# Patient Record
Sex: Female | Born: 1940
Health system: Southern US, Community
[De-identification: ages and names within clinical notes are randomized; demographics above are authoritative.]

## PROBLEM LIST (undated history)

## (undated) DIAGNOSIS — I10 Essential (primary) hypertension: Secondary | ICD-10-CM

## (undated) DIAGNOSIS — E119 Type 2 diabetes mellitus without complications: Secondary | ICD-10-CM

## (undated) DIAGNOSIS — E785 Hyperlipidemia, unspecified: Secondary | ICD-10-CM

## (undated) HISTORY — DX: Type 2 diabetes mellitus without complications: E11.9

## (undated) HISTORY — DX: Hyperlipidemia, unspecified: E78.5

## (undated) HISTORY — DX: Essential (primary) hypertension: I10

---

## 1973-09-01 HISTORY — PX: ABDOMINAL HYSTERECTOMY: SHX81

## 2001-03-01 HISTORY — PX: CATARACT EXTRACTION: SUR2

## 2004-09-20 ENCOUNTER — Ambulatory Visit: Payer: Self-pay

## 2005-03-25 ENCOUNTER — Ambulatory Visit: Payer: Self-pay | Admitting: Internal Medicine

## 2005-03-25 LAB — HM COLONOSCOPY

## 2008-01-30 ENCOUNTER — Emergency Department: Payer: Self-pay | Admitting: Emergency Medicine

## 2010-05-18 ENCOUNTER — Ambulatory Visit: Payer: Self-pay | Admitting: Family Medicine

## 2010-05-18 DIAGNOSIS — I10 Essential (primary) hypertension: Secondary | ICD-10-CM | POA: Insufficient documentation

## 2010-09-29 LAB — CONVERTED CEMR LAB
Glucose, Urine, Semiquant: NEGATIVE
Protein, U semiquant: 100
Urobilinogen, UA: 0.2
pH: 6

## 2010-10-02 NOTE — Assessment & Plan Note (Signed)
Summary: uti/jbb   Vital Signs:  Patient Profile:   70 Years Old Female CC:      lower abdominal pressure, urinary frequency, burning with urination Height:     64.5 inches Weight:      200 pounds O2 Sat:      97 % O2 treatment:    Room Air Temp:     98.3 degrees F oral Pulse rate:   92 / minute Resp:     16 per minute BP sitting:   158 / 90  (left arm)  Vitals Entered By: Adella Hare LPN (May 18, 2010 5:37 PM)                  Prior Medication List:  No prior medications documented  Current Allergies (reviewed today): ! TETRACYCLINE ! AMOXICILLIN ! * BEEF ! * SHRIMPHistory of Present Illness Chief Complaint: lower abdominal pressure, urinary frequency, burning with urination History of Present Illness: Patient is a 70 YOWF w/HX of recurrent UTI and hemorrhagic cystitis. She didstarted having symptoms about a week ago and it has progressive got worse. She has seen blood yet but w/ hr increase symptoms she feels as if that is next.  Current Problems: URINARY TRACT INFECTION (ICD-599.0) HYPERTENSION (ICD-401.9)   Current Meds COZAAR 50 MG TABS (LOSARTAN POTASSIUM) one tab by mouth once daily CIPRO 500 MG TABS (CIPROFLOXACIN HCL) 1 by mouth 2 times daily take for 7-10 days DIFLUCAN 150 MG TABS (FLUCONAZOLE) once daily  REVIEW OF SYSTEMS Constitutional Symptoms      Denies fever, chills, night sweats, weight loss, weight gain, and fatigue.  Eyes       Complains of glasses.      Denies eye pain. Ear/Nose/Throat/Mouth       Denies hearing loss/aids, change in hearing, ear pain, frequent runny nose, sore throat, and hoarseness.  Respiratory       Denies dry cough, productive cough, wheezing, shortness of breath, asthma, bronchitis, and emphysema/COPD.  Cardiovascular       Denies murmurs, chest pain, and tires easily with exhertion.    Gastrointestinal       Denies stomach pain, nausea/vomiting, diarrhea, and constipation.      Comments: hx of  diverticulitis Genitourniary       Complains of painful urination.      Denies kidney stones and loss of urinary control. Neurological       Complains of headaches.      Denies numbness and tngling. Musculoskeletal       Complains of muscle pain and muscle weakness.      Denies joint pain, joint stiffness, and gout.  Skin       Denies unusual mles/lumps or sores.  Psych       Denies anxiety/stress.  Past History:  Past Medical History: Hypertension  Past Surgical History: Hysterectomy  Family History: Reviewed history and no changes required. mother deceased- DM father deceased one brother living- heart disease one sister living- heart disease  Social History: Reviewed history and no changes required. Retired  Problems Prior to Update: None  Medications Prior to Update: 1)  None  Current Medications (verified): 1)  Cozaar 50 Mg Tabs (Losartan Potassium) .... One Tab By Mouth Once Daily  Allergies (verified): 1)  ! Tetracycline 2)  ! Amoxicillin 3)  ! * Beef 4)  ! * Shrimp  Physical Exam General appearance: well developed, well nourished, mild discomfort Head: normocephalic, atraumatic, anterior cervical lymphadenopathy Neck: neck supple,  trachea midline, no masses  Abdomen: mild lower abdominal tenderness Back: mild R CVA tenderness Skin: no obvious rashes or lesions MSE: oriented to time, place, and person Assessment New Problems: URINARY TRACT INFECTION (ICD-599.0) HYPERTENSION (ICD-401.9)  Hemorrhagic UTI  Patient Education: Patient and/or caregiver instructed in the following: rest fluids and Tylenol.  Plan New Medications/Changes: DIFLUCAN 150 MG TABS (FLUCONAZOLE) once daily  #1 x 0, 05/18/2010, Hassan Rowan MD CIPRO 500 MG TABS (CIPROFLOXACIN HCL) 1 by mouth 2 times daily take for 7-10 days  #20 x 0, 05/18/2010, Hassan Rowan MD  New Orders: Urinalysis-dipstick only (Medicare patient) [[id number]] T-Culture, Urine [[id number]] New Patient  Level III [78469] Follow Up: Follow up in 2-3 days if no improvement, Follow up on an as needed basis, Follow up with Primary Physician  The patient and/or caregiver has been counseled thoroughly with regard to medications prescribed including dosage, schedule, interactions, rationale for use, and possible side effects and they verbalize understanding.  Diagnoses and expected course of recovery discussed and will return if not improved as expected or if the condition worsens. Patient and/or caregiver verbalized understanding.  Prescriptions: DIFLUCAN 150 MG TABS (FLUCONAZOLE) once daily  #1 x 0   Entered and Authorized by:   Hassan Rowan MD   Signed by:   Hassan Rowan MD on 05/18/2010   Method used:   Print then Give to Patient   RxID:   [id number] CIPRO 500 MG TABS (CIPROFLOXACIN HCL) 1 by mouth 2 times daily take for 7-10 days  #20 x 0   Entered and Authorized by:   Hassan Rowan MD   Signed by:   Hassan Rowan MD on 05/18/2010   Method used:   Print then Give to Patient   RxID:   [id number]   Patient Instructions: 1)  Please schedule a follow-up appointment in 10-14 days for recheck of urine here or at PCP 2)  Take your antibiotic as prescribed until ALL of it is gone, but stop if you develop a rash or swelling and contact our office as soon as possible. 3)  Please schedule a follow-up appointment as needed. 4)  Cipro dose in 8/10 was 750mg  you were given 500mg  by mouth instead but may take from 7 to 10 days  Orders Added: 1)  Urinalysis-dipstick only (Medicare patient) [[id number]] 2)  T-Culture, Urine [[id number]] 3)  New Patient Level III [99203]  Laboratory Results   Urine Tests  Date/Time Received: May 18, 2010 5:29 PM  Date/Time Reported: May 18, 2010 5:29 PM   Routine Urinalysis   Color: yellow Appearance: Clear Glucose: negative   (Normal Range: Negative) Bilirubin: negative   (Normal Range: Negative) Ketone: negative   (Normal Range:  Negative) Spec. Gravity: 1.025   (Normal Range: 1.003-1.035) Blood: moderate   (Normal Range: Negative) pH: 6.0   (Normal Range: 5.0-8.0) Protein: 100   (Normal Range: Negative) Urobilinogen: 0.2   (Normal Range: 0-1) Nitrite: negative   (Normal Range: Negative) Leukocyte Esterace: trace   (Normal Range: Negative)

## 2011-06-10 ENCOUNTER — Ambulatory Visit: Payer: Self-pay | Admitting: Family Medicine

## 2011-07-10 ENCOUNTER — Ambulatory Visit: Payer: Self-pay | Admitting: Family Medicine

## 2013-11-17 ENCOUNTER — Ambulatory Visit: Payer: Self-pay | Admitting: Family Medicine

## 2014-03-11 ENCOUNTER — Ambulatory Visit: Payer: Self-pay | Admitting: Specialist

## 2014-03-20 ENCOUNTER — Ambulatory Visit: Payer: Self-pay | Admitting: Specialist

## 2014-03-20 DIAGNOSIS — I10 Essential (primary) hypertension: Secondary | ICD-10-CM

## 2014-03-20 DIAGNOSIS — E119 Type 2 diabetes mellitus without complications: Secondary | ICD-10-CM

## 2014-03-20 LAB — BASIC METABOLIC PANEL
ANION GAP: 8 (ref 7–16)
BUN: 13 mg/dL (ref 7–18)
CHLORIDE: 104 mmol/L (ref 98–107)
CO2: 27 mmol/L (ref 21–32)
Calcium, Total: 9.3 mg/dL (ref 8.5–10.1)
Creatinine: 0.95 mg/dL (ref 0.60–1.30)
EGFR (Non-African Amer.): >60
Glucose: 177 mg/dL — ABNORMAL HIGH (ref 65–99)
OSMOLALITY: 282 (ref 275–301)
POTASSIUM: 3.9 mmol/L (ref 3.5–5.1)
SODIUM: 139 mmol/L (ref 136–145)

## 2014-03-24 ENCOUNTER — Ambulatory Visit: Payer: Self-pay | Admitting: Specialist

## 2014-03-24 HISTORY — PX: KNEE SURGERY: SHX244

## 2014-06-06 LAB — HM PAP SMEAR: HM Pap smear: NORMAL

## 2014-07-10 ENCOUNTER — Ambulatory Visit: Payer: Self-pay

## 2014-07-10 LAB — HM MAMMOGRAPHY: HM Mammogram: NORMAL

## 2014-07-13 ENCOUNTER — Ambulatory Visit: Payer: Self-pay | Admitting: Family Medicine

## 2014-12-23 NOTE — Op Note (Signed)
PATIENT NAME:  Danielle Jackson, [name] MR#:  [id number] DATE OF BIRTH:  04/22/1941  DATE OF PROCEDURE:  03/24/2014  PREOPERATIVE DIAGNOSIS: Medial meniscus tear, right knee.   POSTOPERATIVE DIAGNOSES: 1.  Large tear posterior horn medial meniscus, right knee.  2.  Large medial suprapatellar plica of the right knee.  3.  Moderate patellofemoral degenerative arthritis.  4.  Moderate chondromalacia medial compartment articular surfaces.   PROCEDURES: 1.  Arthroscopic partial medial meniscectomy.  2.  Arthroscopic resection medial suprapatellar plica.   SURGEON: Myra Rudehristopher , M.D.   ANESTHESIA: General.   COMPLICATIONS: None.   BLOOD LOSS: None.   DESCRIPTION OF PROCEDURE: After adequate induction of general anesthesia, the right lower extremity is secured in a leg holder in the usual manner for arthroscopy. The right knee is thoroughly prepped with alcohol and ChloraPrep and draped in standard sterile fashion. The joint is infiltrated with 5 mL of Marcaine with epinephrine. Diagnostic arthroscopy is performed. There is a slight joint effusion present. There is mildly increased synovitis in the suprapatellar pouch. There is a large medial suprapatellar plica with a prominent area of erosion of the medial femoral condyle where this is rubbing on flexion and extension. There is moderate degenerative change also on this medial side and the patellofemoral joint. In the medial compartment, there is mild to moderate chondromalacia of the articular surface that is associated with a large complex tear of the posterior horn of the medial meniscus. Using the basket forceps, the full radial resector and the ArthroWand the torn portion of the meniscus was resected back to a clean stable rim. Likewise, using the full radial resector and the ArthroWand, the medial suprapatellar plica was completely excised. The anterior cruciate ligament is normal. In the lateral compartment, no abnormalities are noted. The joint  is then thoroughly irrigated multiple times. Wounds are closed with 5-0 nylon. A soft bulky dressing is applied. The patient is returned to the recovery room in satisfactory condition having tolerated the procedure quite well. ____________________________ Clare Gandyhristopher E. , MD ces:sb D: 03/24/2014 09:07:46 ET T: 03/24/2014 10:06:30 ET JOB#: [id number]  cc: Clare Gandyhristopher E. , MD, <Dictator> Clare GandyHRISTOPHER E  MD ELECTRONICALLY SIGNED 03/30/2014 13:24

## 2015-01-27 DIAGNOSIS — M222X9 Patellofemoral disorders, unspecified knee: Secondary | ICD-10-CM | POA: Insufficient documentation

## 2015-01-27 DIAGNOSIS — I10 Essential (primary) hypertension: Secondary | ICD-10-CM | POA: Insufficient documentation

## 2015-01-27 DIAGNOSIS — M8580 Other specified disorders of bone density and structure, unspecified site: Secondary | ICD-10-CM | POA: Insufficient documentation

## 2015-01-27 DIAGNOSIS — E669 Obesity, unspecified: Secondary | ICD-10-CM | POA: Insufficient documentation

## 2015-01-27 DIAGNOSIS — E7800 Pure hypercholesterolemia, unspecified: Secondary | ICD-10-CM | POA: Insufficient documentation

## 2015-01-27 DIAGNOSIS — S83209A Unspecified tear of unspecified meniscus, current injury, unspecified knee, initial encounter: Secondary | ICD-10-CM | POA: Insufficient documentation

## 2015-01-27 DIAGNOSIS — R9431 Abnormal electrocardiogram [ECG] [EKG]: Secondary | ICD-10-CM | POA: Insufficient documentation

## 2015-01-27 DIAGNOSIS — N6019 Diffuse cystic mastopathy of unspecified breast: Secondary | ICD-10-CM | POA: Insufficient documentation

## 2015-01-27 DIAGNOSIS — E119 Type 2 diabetes mellitus without complications: Secondary | ICD-10-CM | POA: Insufficient documentation

## 2015-01-27 DIAGNOSIS — K5730 Diverticulosis of large intestine without perforation or abscess without bleeding: Secondary | ICD-10-CM | POA: Insufficient documentation

## 2015-01-27 DIAGNOSIS — J309 Allergic rhinitis, unspecified: Secondary | ICD-10-CM | POA: Insufficient documentation

## 2015-01-27 DIAGNOSIS — K589 Irritable bowel syndrome without diarrhea: Secondary | ICD-10-CM | POA: Insufficient documentation

## 2015-01-27 DIAGNOSIS — E559 Vitamin D deficiency, unspecified: Secondary | ICD-10-CM | POA: Insufficient documentation

## 2015-03-07 ENCOUNTER — Encounter: Payer: Self-pay | Admitting: Family Medicine

## 2015-03-07 ENCOUNTER — Ambulatory Visit (INDEPENDENT_AMBULATORY_CARE_PROVIDER_SITE_OTHER): Payer: PPO | Admitting: Family Medicine

## 2015-03-07 VITALS — BP 132/72 | HR 64 | Temp 98.1°F | Resp 16 | Ht 64.0 in | Wt 195.4 lb

## 2015-03-07 DIAGNOSIS — I10 Essential (primary) hypertension: Secondary | ICD-10-CM | POA: Diagnosis not present

## 2015-03-07 DIAGNOSIS — E7800 Pure hypercholesterolemia, unspecified: Secondary | ICD-10-CM

## 2015-03-07 DIAGNOSIS — E119 Type 2 diabetes mellitus without complications: Secondary | ICD-10-CM

## 2015-03-07 LAB — POCT GLYCOSYLATED HEMOGLOBIN (HGB A1C): Hemoglobin A1C: 6.2

## 2015-03-07 NOTE — Patient Instructions (Signed)
Continue exercise and wise food choices

## 2015-03-07 NOTE — Progress Notes (Signed)
Subjective:     Patient ID: Angela CoxBrenda S Wadlow, female   DOB: 1941/07/06, 74 y.o.   MRN: [id number]  HPI  Chief Complaint  Patient presents with  . Diabetes    Patient is present in offie today for follow up visit. LOV 10/20/14, controlled with diet and exercise hgb A1c in house 6.8%  . Hypertension    follow up form 10/20/14, continue Losartan. Blood pressure in office at visit 116/70  . Hyperlipidemia    follow up from 10/20/14, continue atorvastatin  States she has been walking more after her right knee surgery.   Review of Systems  Respiratory: Negative for shortness of breath.   Cardiovascular: Negative for chest pain and palpitations.  Gastrointestinal:       She is due repeat colonoscopy this year and wishes to see Dr. Evette CristalSankar. Wants to wait until the Fall for the referral.       Objective:   Physical Exam  Constitutional: She appears well-developed and well-nourished. No distress.   Lungs: clear Heart: RRR without murmur Lower extremities: no edema; pedal pulses intact, sensation to monofilament intact, no wounds noted.     Assessment:    1. Type 2 diabetes mellitus without complication - POCT glycosylated hemoglobin (Hb A1C) - Comprehensive metabolic panel  2. Essential hypertension - Comprehensive metabolic panel  3. Hypercholesteremia - Lipid panel    Plan:    Continue diet control of diabetes Continue losartan and atorvastatin F/u in 3 months

## 2015-03-08 ENCOUNTER — Telehealth: Payer: Self-pay

## 2015-03-08 LAB — COMPREHENSIVE METABOLIC PANEL
A/G RATIO: 1.5 (ref 1.1–2.5)
ALT: 18 IU/L (ref 0–32)
AST: 25 IU/L (ref 0–40)
Albumin: 4 g/dL (ref 3.5–4.8)
Alkaline Phosphatase: 71 IU/L (ref 39–117)
BUN/Creatinine Ratio: 16 (ref 11–26)
BUN: 13 mg/dL (ref 8–27)
Bilirubin Total: 0.4 mg/dL (ref 0.0–1.2)
CALCIUM: 9.8 mg/dL (ref 8.7–10.3)
CO2: 21 mmol/L (ref 18–29)
CREATININE: 0.8 mg/dL (ref 0.57–1.00)
Chloride: 98 mmol/L (ref 97–108)
GFR calc Af Amer: 85 mL/min/{1.73_m2} (ref >59)
GFR, EST NON AFRICAN AMERICAN: 74 mL/min/{1.73_m2} (ref >59)
Globulin, Total: 2.6 g/dL (ref 1.5–4.5)
Glucose: 147 mg/dL — ABNORMAL HIGH (ref 65–99)
Potassium: 4.5 mmol/L (ref 3.5–5.2)
Sodium: 139 mmol/L (ref 134–144)
Total Protein: 6.6 g/dL (ref 6.0–8.5)

## 2015-03-08 LAB — LIPID PANEL
CHOL/HDL RATIO: 4.7 ratio — AB (ref 0.0–4.4)
Cholesterol, Total: 169 mg/dL (ref 100–199)
HDL: 36 mg/dL — AB (ref >39)
LDL Calculated: 62 mg/dL (ref 0–99)
Triglycerides: 356 mg/dL — ABNORMAL HIGH (ref 0–149)
VLDL CHOLESTEROL CAL: 71 mg/dL — AB (ref 5–40)

## 2015-03-08 NOTE — Telephone Encounter (Signed)
Patient has been advised

## 2015-03-08 NOTE — Telephone Encounter (Signed)
-----   Message from Anola Gurneyobert Chauvin, [place] sent at 03/08/2015  7:44 AM EDT ----- Regarding: lab Labs ok. HLD cholesterol is a bit low but will improve as you exercise more. Triglycerides are mildly elevated-pay attention to low fat food choices.

## 2015-04-11 ENCOUNTER — Other Ambulatory Visit: Payer: Self-pay | Admitting: Family Medicine

## 2015-06-08 ENCOUNTER — Encounter: Payer: Self-pay | Admitting: Family Medicine

## 2015-06-08 ENCOUNTER — Ambulatory Visit (INDEPENDENT_AMBULATORY_CARE_PROVIDER_SITE_OTHER): Payer: PPO | Admitting: Family Medicine

## 2015-06-08 VITALS — BP 130/74 | HR 76 | Temp 97.6°F | Resp 16 | Wt 193.8 lb

## 2015-06-08 DIAGNOSIS — Z23 Encounter for immunization: Secondary | ICD-10-CM | POA: Diagnosis not present

## 2015-06-08 DIAGNOSIS — M791 Myalgia, unspecified site: Secondary | ICD-10-CM

## 2015-06-08 DIAGNOSIS — I10 Essential (primary) hypertension: Secondary | ICD-10-CM | POA: Diagnosis not present

## 2015-06-08 DIAGNOSIS — E119 Type 2 diabetes mellitus without complications: Secondary | ICD-10-CM | POA: Diagnosis not present

## 2015-06-08 LAB — POCT GLYCOSYLATED HEMOGLOBIN (HGB A1C): HEMOGLOBIN A1C: 6.6

## 2015-06-08 NOTE — Patient Instructions (Addendum)
Continue to increase walking daily. Stop atorvastatin and see if your muscle cramps improve. I

## 2015-06-08 NOTE — Progress Notes (Signed)
Subjective:     Patient ID: Danielle Jackson, female   DOB: 05/19/1941, 74 y.o.   MRN: [id number]  HPI  Chief Complaint  Patient presents with  . Diabetes    Follow up from 03/07/15, last Hgb A1C 6.2%  . Hypertension    Follow up from 03/07/15, CMP ordered patient advised to continue on medication and reports good compliance, tolerance and symptom control on medication.  . Hyperlipidemia    Follow up from 03/07/15, patient advised to continue Atorvastatin. Patient reports that she believes medication is causing her to have muscle pain and she would like to address today taking a trial off medication to see if problem will resolve.   States her knee is feeling better and she is walking 15-20 minutes daily.   Review of Systems  Respiratory: Negative for shortness of breath.   Cardiovascular: Negative for chest pain and palpitations.  Gastrointestinal:       Wishes to wait until early next year for her screening colonoscopy.       Objective:   Physical Exam  Constitutional: She appears well-developed and well-nourished. No distress.  Cardiovascular: Normal rate and regular rhythm.   Pulmonary/Chest: Breath sounds normal.  Musculoskeletal: She exhibits no edema (of lower extremities).       Assessment:    1. Controlled type 2 diabetes mellitus without complication, without long-term current use of insulin (HCC) - POCT glycosylated hemoglobin (Hb A1C)  2. Need for influenza vaccination - Flu vaccine HIGH DOSE PF  3. Benign essential HTN: stable  4. Myalgia: ? Statin related    Plan:    Trial off atorvastatin. Continue to increase exercise time.

## 2015-07-17 LAB — HM DIABETES EYE EXAM

## 2015-09-05 ENCOUNTER — Ambulatory Visit: Payer: PPO | Admitting: Family Medicine

## 2015-09-06 ENCOUNTER — Other Ambulatory Visit: Payer: Self-pay | Admitting: Family Medicine

## 2015-10-03 ENCOUNTER — Ambulatory Visit (INDEPENDENT_AMBULATORY_CARE_PROVIDER_SITE_OTHER): Payer: PPO | Admitting: Family Medicine

## 2015-10-03 ENCOUNTER — Encounter: Payer: Self-pay | Admitting: Family Medicine

## 2015-10-03 VITALS — BP 122/66 | HR 82 | Temp 97.6°F | Resp 16 | Wt 190.2 lb

## 2015-10-03 DIAGNOSIS — E7800 Pure hypercholesterolemia, unspecified: Secondary | ICD-10-CM | POA: Diagnosis not present

## 2015-10-03 DIAGNOSIS — E119 Type 2 diabetes mellitus without complications: Secondary | ICD-10-CM

## 2015-10-03 DIAGNOSIS — J0120 Acute ethmoidal sinusitis, unspecified: Secondary | ICD-10-CM

## 2015-10-03 DIAGNOSIS — I10 Essential (primary) hypertension: Secondary | ICD-10-CM

## 2015-10-03 DIAGNOSIS — Z1239 Encounter for other screening for malignant neoplasm of breast: Secondary | ICD-10-CM

## 2015-10-03 LAB — POCT GLYCOSYLATED HEMOGLOBIN (HGB A1C): Hemoglobin A1C: 6.5

## 2015-10-03 MED ORDER — SULFAMETHOXAZOLE-TRIMETHOPRIM 800-160 MG PO TABS: 1.0000 | ORAL_TABLET | Freq: Two times a day (BID) | ORAL | Status: DC

## 2015-10-03 NOTE — Patient Instructions (Addendum)
Continue Mucinex and Sudafed.

## 2015-10-03 NOTE — Progress Notes (Signed)
Subjective:     Patient ID: Danielle Jackson, female   DOB: 02-Sep-1940, 75 y.o.   MRN: [id number]  HPI  Chief Complaint  Patient presents with  . Diabetes    Follow up visit from 06/08/15, diabetes was well controlled, HgbA1C in house was 6.6%.  Marland Kitchen Hypertension    Follow up from 06/08/15, condition stable blood pressure last visit was 130/74  . Hyperlipidemia    Follow up from 06/08/15, unsure if myalgia was related to patients statin drug. Plan at last visit was trial off Atorvastatin and to increase exercise. Patient states that stopping Atorvastatin made no change and since she has noticed a lump appear under her skin on her left arm.   . Cough    Patient would like to address ongoing cough that she has had for the past several days, patient states that it seems to be improving on Mucinex D but she still has cough.   Weight is down 3# and reflected in decreased A1C today. Reports cold sx on or about 1/14.Patient reports increased sinus pressure, purulent  post nasal drainage and accompanying cough. Wishes to schedule screening mammogram.   Review of Systems  Cardiovascular:       Has resumed atorvastatin as did not seem to affect muscle aches at that time.  Gastrointestinal:       Wishes to no longer have any colonoscopies.  Musculoskeletal:       States at times her forearms feel swollen L > R. Reports her right knee is improved after surgery 03/24/14       Objective:   Physical Exam  Constitutional: She appears well-developed and well-nourished. No distress.  Ears: T.M's intact without inflammation Sinuses: mild paranasal sinus tenderness Throat: no tonsillar enlargement or exudate Neck: no cervical adenopathy Lungs: clear Heart: RRR without murmur Skin: no rash, swelling, or mass noted on anterior left forearm.     Assessment:    1. Controlled type 2 diabetes mellitus without complication, without long-term current use of insulin [facility]): Diet controlled - POCT glycosylated  hemoglobin (Hb A1C)  2. Benign essential HTN: controlled on present medication  3. Hypercholesteremia: continue atorvastatin  4. Acute ethmoidal sinusitis, recurrence not specified - sulfamethoxazole-trimethoprim (BACTRIM DS,SEPTRA DS) 800-160 MG tablet; Take 1 tablet by mouth 2 (two) times daily.  Dispense: 20 tablet; Refill: 0  5. Screening for breast cancer - MM DIGITAL SCREENING BILATERAL; Future    Plan:    F/u 3 months or sooner prn.

## 2015-10-05 ENCOUNTER — Other Ambulatory Visit: Payer: Self-pay | Admitting: Family Medicine

## 2015-11-01 ENCOUNTER — Encounter: Payer: Self-pay | Admitting: Family Medicine

## 2015-11-01 ENCOUNTER — Ambulatory Visit (INDEPENDENT_AMBULATORY_CARE_PROVIDER_SITE_OTHER): Payer: PPO | Admitting: Family Medicine

## 2015-11-01 VITALS — BP 110/76 | HR 87 | Temp 97.9°F | Resp 18 | Wt 195.0 lb

## 2015-11-01 DIAGNOSIS — J069 Acute upper respiratory infection, unspecified: Secondary | ICD-10-CM

## 2015-11-01 MED ORDER — HYDROCODONE-HOMATROPINE 5-1.5 MG/5ML PO SYRP: ORAL_SOLUTION | ORAL | Status: DC

## 2015-11-01 NOTE — Progress Notes (Signed)
Subjective:     Patient ID: Danielle Jackson, female   DOB: 01-19-1941, 75 y.o.   MRN: [id number]  HPI  Chief Complaint  Patient presents with  . Cough    Patient comes in office today with complaints of cough and chest congestion for the past four days. Patient reports having clear drainage and a low grade fever high of 99, she has taken otc Mucinex with no relief.   Reports onset of sore throat and sinus congestion on 2/25. Reports PND with accompanying cough.   Review of Systems     Objective:   Physical Exam  Constitutional: She appears well-developed and well-nourished. No distress.  Ears: T.M's intact without inflammation Throat: no tonsillar enlargement or exudate Neck: no cervical adenopathy Lungs: clear     Assessment:    1. Upper respiratory infection - HYDROcodone-homatropine (HYCODAN) 5-1.5 MG/5ML syrup; 5 ml 4-6 hours as needed for cough  Dispense: 240 mL; Refill: 0    Plan:    Discussed over the counter medication use.

## 2015-11-01 NOTE — Patient Instructions (Signed)
Continue Mucinex DM. May also add Claritin to help dry up sinus drip/drainage.

## 2016-01-04 ENCOUNTER — Ambulatory Visit: Payer: PPO | Admitting: Family Medicine

## 2016-02-05 ENCOUNTER — Ambulatory Visit: Payer: PPO | Admitting: Family Medicine

## 2016-03-06 ENCOUNTER — Encounter: Payer: Self-pay | Admitting: Family Medicine

## 2016-03-17 ENCOUNTER — Ambulatory Visit (INDEPENDENT_AMBULATORY_CARE_PROVIDER_SITE_OTHER): Payer: PPO | Admitting: Family Medicine

## 2016-03-17 ENCOUNTER — Encounter: Payer: Self-pay | Admitting: Family Medicine

## 2016-03-17 VITALS — BP 110/66 | HR 78 | Temp 98.1°F | Resp 16 | Ht 64.0 in | Wt 190.0 lb

## 2016-03-17 DIAGNOSIS — J301 Allergic rhinitis due to pollen: Secondary | ICD-10-CM

## 2016-03-17 DIAGNOSIS — I10 Essential (primary) hypertension: Secondary | ICD-10-CM | POA: Diagnosis not present

## 2016-03-17 DIAGNOSIS — E119 Type 2 diabetes mellitus without complications: Secondary | ICD-10-CM

## 2016-03-17 DIAGNOSIS — E7800 Pure hypercholesterolemia, unspecified: Secondary | ICD-10-CM | POA: Diagnosis not present

## 2016-03-17 LAB — POCT GLYCOSYLATED HEMOGLOBIN (HGB A1C)
Est. average glucose Bld gHb Est-mCnc: 151
HEMOGLOBIN A1C: 6.9

## 2016-03-17 NOTE — Patient Instructions (Addendum)
Please call [facility] to schedule a screening mammogram. Keep up your walking sessions. If you are unable to walk daily consider increasing the time to 45 minutes. We will call you with the lab results. Consider using the Flonase daily for allergies.

## 2016-03-17 NOTE — Progress Notes (Signed)
Subjective:     Patient ID: Danielle Jackson, female   DOB: 06/29/1941, 75 y.o.   MRN: [id number]  HPI  Chief Complaint  Patient presents with  . Diabetes    10/03/15 A1c  was at 6.5%  . Hypertension    10/03/15 BP was 122/66  . Allergic Rhinitis   States she has been intermittent trouble with her allergies but has been using fluticasone on an as needed basis rather than scheduled. States she is currently walking 30 minutes qod. Reports she is limited at times by her knee replacement. Wishes to try a herbal product called berberine to help control her diabetes. Asked her to focus more on exercise.   Review of Systems  Respiratory: Negative for shortness of breath.   Cardiovascular: Negative for chest pain and palpitations.  Skin:       Reports hair thinning she attributes to atorvastatin       Objective:   Physical Exam  Constitutional: She appears well-developed and well-nourished. No distress.  Lungs: clear Heart: RRR without murmur Lower extremities: no edema; pedal pulses intact, sensation to monofilament intact, no wounds noted.     Assessment:    1. Controlled type 2 diabetes mellitus without complication, without long-term current use of insulin (HCC) - POCT glycosylated hemoglobin (Hb A1C)  2. Hypercholesteremia - Lipid panel  3. Benign essential HTN - Comprehensive metabolic panel  4. Allergic rhinitis due to pollen    Plan:    Further f/u pending lab work. Encourage exercise and scheduled use of steroid nasal spray. Schedule mammogram.

## 2016-03-19 ENCOUNTER — Other Ambulatory Visit: Payer: Self-pay | Admitting: Family Medicine

## 2016-03-19 DIAGNOSIS — I10 Essential (primary) hypertension: Secondary | ICD-10-CM | POA: Diagnosis not present

## 2016-03-19 DIAGNOSIS — E7800 Pure hypercholesterolemia, unspecified: Secondary | ICD-10-CM | POA: Diagnosis not present

## 2016-03-20 LAB — COMPREHENSIVE METABOLIC PANEL
A/G RATIO: 1.4 (ref 1.2–2.2)
ALK PHOS: 73 IU/L (ref 39–117)
ALT: 17 IU/L (ref 0–32)
AST: 16 IU/L (ref 0–40)
Albumin: 4 g/dL (ref 3.5–4.8)
BILIRUBIN TOTAL: 0.4 mg/dL (ref 0.0–1.2)
BUN/Creatinine Ratio: 20 (ref 12–28)
BUN: 17 mg/dL (ref 8–27)
CALCIUM: 9.8 mg/dL (ref 8.7–10.3)
CHLORIDE: 98 mmol/L (ref 96–106)
CO2: 22 mmol/L (ref 18–29)
Creatinine, Ser: 0.83 mg/dL (ref 0.57–1.00)
GFR calc Af Amer: 81 mL/min/{1.73_m2} (ref >59)
GFR calc non Af Amer: 70 mL/min/{1.73_m2} (ref >59)
Globulin, Total: 2.8 g/dL (ref 1.5–4.5)
Glucose: 148 mg/dL — ABNORMAL HIGH (ref 65–99)
POTASSIUM: 4.6 mmol/L (ref 3.5–5.2)
Sodium: 139 mmol/L (ref 134–144)
Total Protein: 6.8 g/dL (ref 6.0–8.5)

## 2016-03-20 LAB — LIPID PANEL
CHOLESTEROL TOTAL: 154 mg/dL (ref 100–199)
Chol/HDL Ratio: 4.1 ratio units (ref 0.0–4.4)
HDL: 38 mg/dL — AB (ref >39)
LDL Calculated: 56 mg/dL (ref 0–99)
TRIGLYCERIDES: 302 mg/dL — AB (ref 0–149)
VLDL Cholesterol Cal: 60 mg/dL — ABNORMAL HIGH (ref 5–40)

## 2016-03-21 LAB — COMPREHENSIVE METABOLIC PANEL

## 2016-03-21 LAB — LIPID PANEL

## 2016-04-02 ENCOUNTER — Other Ambulatory Visit: Payer: Self-pay | Admitting: Family Medicine

## 2016-04-02 DIAGNOSIS — E7800 Pure hypercholesterolemia, unspecified: Secondary | ICD-10-CM

## 2016-04-07 ENCOUNTER — Ambulatory Visit
Admission: RE | Admit: 2016-04-07 | Discharge: 2016-04-07 | Disposition: A | Discharge status: Home / Self Care | Payer: PPO | Source: Ambulatory Visit | Attending: Family Medicine | Admitting: Family Medicine

## 2016-04-07 ENCOUNTER — Other Ambulatory Visit: Payer: Self-pay | Admitting: Family Medicine

## 2016-04-07 DIAGNOSIS — Z1231 Encounter for screening mammogram for malignant neoplasm of breast: Secondary | ICD-10-CM | POA: Insufficient documentation

## 2016-04-07 DIAGNOSIS — Z1239 Encounter for other screening for malignant neoplasm of breast: Secondary | ICD-10-CM

## 2016-04-28 ENCOUNTER — Other Ambulatory Visit: Payer: Self-pay | Admitting: Family Medicine

## 2016-04-28 DIAGNOSIS — E119 Type 2 diabetes mellitus without complications: Secondary | ICD-10-CM

## 2016-04-28 MED ORDER — GLUCOSE BLOOD VI STRP: ORAL_STRIP | 6 refills | Status: DC

## 2016-06-17 ENCOUNTER — Ambulatory Visit: Payer: PPO | Admitting: Family Medicine

## 2016-07-02 DIAGNOSIS — E119 Type 2 diabetes mellitus without complications: Secondary | ICD-10-CM | POA: Diagnosis not present

## 2016-07-02 DIAGNOSIS — H18463 Peripheral corneal degeneration, bilateral: Secondary | ICD-10-CM | POA: Diagnosis not present

## 2016-07-02 DIAGNOSIS — H35033 Hypertensive retinopathy, bilateral: Secondary | ICD-10-CM | POA: Diagnosis not present

## 2016-07-08 ENCOUNTER — Encounter: Payer: Self-pay | Admitting: Family Medicine

## 2016-07-14 ENCOUNTER — Other Ambulatory Visit: Payer: Self-pay | Admitting: Family Medicine

## 2016-07-14 ENCOUNTER — Encounter: Payer: Self-pay | Admitting: Family Medicine

## 2016-07-14 ENCOUNTER — Ambulatory Visit (INDEPENDENT_AMBULATORY_CARE_PROVIDER_SITE_OTHER): Payer: PPO | Admitting: Family Medicine

## 2016-07-14 VITALS — BP 162/92 | HR 76 | Temp 98.2°F | Resp 16 | Wt 191.0 lb

## 2016-07-14 DIAGNOSIS — R300 Dysuria: Secondary | ICD-10-CM | POA: Diagnosis not present

## 2016-07-14 DIAGNOSIS — E119 Type 2 diabetes mellitus without complications: Secondary | ICD-10-CM | POA: Diagnosis not present

## 2016-07-14 LAB — POCT URINALYSIS DIPSTICK
Bilirubin, UA: NEGATIVE
Glucose, UA: NEGATIVE
Ketones, UA: NEGATIVE
Leukocytes, UA: NEGATIVE
Nitrite, UA: NEGATIVE
Spec Grav, UA: 1.015
Urobilinogen, UA: 0.2
pH, UA: 6

## 2016-07-14 LAB — POCT GLYCOSYLATED HEMOGLOBIN (HGB A1C): Hemoglobin A1C: 7.3

## 2016-07-14 MED ORDER — GLIPIZIDE ER 5 MG PO TB24: ORAL_TABLET | ORAL | 2 refills | Status: DC

## 2016-07-14 NOTE — Patient Instructions (Signed)
Increase fluids and we will call you with the culture results. Try the glipizide prior to your largest meal of the day.

## 2016-07-14 NOTE — Progress Notes (Signed)
Subjective:     Patient ID: Danielle Jackson, female   DOB: [birth year], 75 y.o.   MRN: [id number]  HPI  Chief Complaint  Patient presents with  . Diabetes    LOV 03/17/2016. A1C was 6.9% at that time. Pt's at home fasting BS range from 130-155. Denies hypoglycemic incidents. Denies polydipsia, polyuria, foot neuropathy, foot sores.  . Urinary Urgency    x 1 week. Denies burning with urination. Is requesting to check UA today.     Review of Systems  Constitutional: Negative for chills and fever.  Respiratory: Negative for shortness of breath.   Cardiovascular: Negative for chest pain and palpitations.       Objective:   Physical Exam  Constitutional: She appears well-developed and well-nourished. No distress.  Cardiovascular: Normal rate and regular rhythm.   Pulmonary/Chest: Breath sounds normal.  Genitourinary:  Genitourinary Comments: No CVA tenderness  Musculoskeletal: She exhibits no edema (of lower extremities).       Assessment:    1. Controlled type 2 diabetes mellitus without complication, without long-term current use of insulin (HCC): no longer well diet controlled - POCT glycosylated hemoglobin (Hb A1C) - glipiZIDE (GLUCOTROL XL) 5 MG 24 hr tablet; Take one tablet 30 minutes before the biggest meal of the day  Dispense: 30 tablet; Refill: 2  2. Dysuria - POCT urinalysis dipstick - Urine culture    Plan:    Further f/u pending urine culture.

## 2016-07-15 LAB — URINE CULTURE

## 2016-07-16 ENCOUNTER — Telehealth: Payer: Self-pay

## 2016-07-16 NOTE — Telephone Encounter (Signed)
Patient was advised she states that she felt much better day after office visit. KW

## 2016-07-16 NOTE — Telephone Encounter (Signed)
-----   Message from Anola Gurneyobert Chauvin, [place] sent at 07/16/2016  7:28 AM EST ----- No infection detected. Are your urinary symptoms improving with better control of your sugar?

## 2016-07-16 NOTE — Telephone Encounter (Signed)
[id number] 

## 2016-09-29 ENCOUNTER — Other Ambulatory Visit: Payer: Self-pay | Admitting: Family Medicine

## 2016-09-29 DIAGNOSIS — E119 Type 2 diabetes mellitus without complications: Secondary | ICD-10-CM

## 2016-10-14 ENCOUNTER — Encounter: Payer: Self-pay | Admitting: Family Medicine

## 2016-10-14 ENCOUNTER — Ambulatory Visit (INDEPENDENT_AMBULATORY_CARE_PROVIDER_SITE_OTHER): Payer: PPO | Admitting: Family Medicine

## 2016-10-14 VITALS — BP 122/70 | HR 74 | Temp 98.7°F | Resp 16 | Wt 191.0 lb

## 2016-10-14 DIAGNOSIS — I10 Essential (primary) hypertension: Secondary | ICD-10-CM | POA: Diagnosis not present

## 2016-10-14 DIAGNOSIS — E119 Type 2 diabetes mellitus without complications: Secondary | ICD-10-CM | POA: Diagnosis not present

## 2016-10-14 LAB — POCT GLYCOSYLATED HEMOGLOBIN (HGB A1C): HEMOGLOBIN A1C: 6.4

## 2016-10-14 NOTE — Progress Notes (Signed)
Subjective:     Patient ID: Danielle Jackson, female   DOB: 01/04/1941, 76 y.o.   MRN: [id number]  HPI  Chief Complaint  Patient presents with  . Diabetes    Patient reports that she has been tolerating Glipizide well. Last HgbA1c was 07/14/16 ansd was 7.3.  . Hypertension    Patient reports that she occasionally checkes BP at home and reports it has been stable.   Last bone density screen in 2015 with osteopenia   Review of Systems  Respiratory: Negative for shortness of breath.   Cardiovascular: Negative for chest pain and palpitations.       Objective:   Physical Exam  Constitutional: She appears well-developed and well-nourished. No distress.  Cardiovascular: Normal rate and regular rhythm.   Pulmonary/Chest: Breath sounds normal.  Musculoskeletal: She exhibits no edema (of lower extremities).       Assessment:    1. Controlled type 2 diabetes mellitus without complication, without long-term current use of insulin (HCC): improved - POCT glycosylated hemoglobin (Hb A1C)  2. Benign essential HTN: stable    Plan:    Encouraged walking program 30 minutes daily. Will update labs including Vitamin D at next o.v. Update bone density screen.

## 2016-10-14 NOTE — Patient Instructions (Signed)
Encourage working up to 30 minutes of walking daily.

## 2016-12-29 ENCOUNTER — Other Ambulatory Visit: Payer: Self-pay | Admitting: Family Medicine

## 2016-12-29 DIAGNOSIS — E119 Type 2 diabetes mellitus without complications: Secondary | ICD-10-CM

## 2017-01-13 ENCOUNTER — Ambulatory Visit: Payer: PPO | Admitting: Family Medicine

## 2017-01-27 ENCOUNTER — Ambulatory Visit (INDEPENDENT_AMBULATORY_CARE_PROVIDER_SITE_OTHER): Payer: PPO | Admitting: Family Medicine

## 2017-01-27 ENCOUNTER — Encounter: Payer: Self-pay | Admitting: Family Medicine

## 2017-01-27 VITALS — BP 124/68 | HR 72 | Temp 98.4°F | Resp 16 | Wt 199.0 lb

## 2017-01-27 DIAGNOSIS — E559 Vitamin D deficiency, unspecified: Secondary | ICD-10-CM | POA: Diagnosis not present

## 2017-01-27 DIAGNOSIS — E7800 Pure hypercholesterolemia, unspecified: Secondary | ICD-10-CM | POA: Diagnosis not present

## 2017-01-27 DIAGNOSIS — E119 Type 2 diabetes mellitus without complications: Secondary | ICD-10-CM | POA: Diagnosis not present

## 2017-01-27 DIAGNOSIS — I10 Essential (primary) hypertension: Secondary | ICD-10-CM

## 2017-01-27 LAB — POCT GLYCOSYLATED HEMOGLOBIN (HGB A1C): Hemoglobin A1C: 6.7

## 2017-01-27 NOTE — Progress Notes (Signed)
Subjective:     Patient ID: Danielle Jackson, female   DOB: 1941-08-21, 76 y.o.   MRN: [id number]  HPI  Chief Complaint  Patient presents with  . Diabetes  . Hypertension  States she has started walking for 30 minutes most days. She will be pending eye exam in November. Defers further colonoscopies and does not wish a bone density exam.   Review of Systems  Respiratory: Negative for shortness of breath.   Cardiovascular: Negative for chest pain and palpitations.       Objective:   Physical Exam  Constitutional: She appears well-developed and well-nourished. No distress.  Lungs: clear Heart: RRR without murmur Lower extremities: no edema; pedal pulses intact, sensation to monofilament intact, no wounds noted.     Assessment:    1. Benign essential HTN: stable - Comprehensive metabolic panel  2. Controlled type 2 diabetes mellitus without complication, without long-term current use of insulin (HCC) - POCT glycosylated hemoglobin (Hb A1C)  3. Hypercholesteremia - Lipid panel  4. Avitaminosis D - VITAMIN D 25 Hydroxy (Vit-D Deficiency, Fractures)    Plan:    Further f/u pending lab work.

## 2017-01-27 NOTE — Patient Instructions (Signed)
We will call you with the lab work. 

## 2017-01-28 DIAGNOSIS — R300 Dysuria: Secondary | ICD-10-CM | POA: Diagnosis not present

## 2017-01-29 LAB — LIPID PANEL
CHOL/HDL RATIO: 3.6 ratio (ref 0.0–4.4)
CHOLESTEROL TOTAL: 144 mg/dL (ref 100–199)
HDL: 40 mg/dL (ref >39)
LDL CALC: 51 mg/dL (ref 0–99)
Triglycerides: 263 mg/dL — ABNORMAL HIGH (ref 0–149)
VLDL Cholesterol Cal: 53 mg/dL — ABNORMAL HIGH (ref 5–40)

## 2017-01-29 LAB — COMPREHENSIVE METABOLIC PANEL
ALT: 17 IU/L (ref 0–32)
AST: 21 IU/L (ref 0–40)
Albumin/Globulin Ratio: 1.5 (ref 1.2–2.2)
Albumin: 4 g/dL (ref 3.5–4.8)
Alkaline Phosphatase: 79 IU/L (ref 39–117)
BILIRUBIN TOTAL: 0.4 mg/dL (ref 0.0–1.2)
BUN/Creatinine Ratio: 16 (ref 12–28)
BUN: 16 mg/dL (ref 8–27)
CALCIUM: 9.1 mg/dL (ref 8.7–10.3)
CHLORIDE: 105 mmol/L (ref 96–106)
CO2: 22 mmol/L (ref 18–29)
Creatinine, Ser: 0.98 mg/dL (ref 0.57–1.00)
GFR calc Af Amer: 66 mL/min/{1.73_m2} (ref >59)
GFR, EST NON AFRICAN AMERICAN: 57 mL/min/{1.73_m2} — AB (ref >59)
GLUCOSE: 137 mg/dL — AB (ref 65–99)
Globulin, Total: 2.7 g/dL (ref 1.5–4.5)
Potassium: 4.4 mmol/L (ref 3.5–5.2)
Sodium: 142 mmol/L (ref 134–144)
Total Protein: 6.7 g/dL (ref 6.0–8.5)

## 2017-01-29 LAB — VITAMIN D 25 HYDROXY (VIT D DEFICIENCY, FRACTURES): Vit D, 25-Hydroxy: 44.1 ng/mL (ref 30.0–100.0)

## 2017-03-27 ENCOUNTER — Other Ambulatory Visit: Payer: Self-pay | Admitting: Family Medicine

## 2017-03-27 DIAGNOSIS — E119 Type 2 diabetes mellitus without complications: Secondary | ICD-10-CM

## 2017-05-05 ENCOUNTER — Ambulatory Visit (INDEPENDENT_AMBULATORY_CARE_PROVIDER_SITE_OTHER): Payer: PPO | Admitting: Family Medicine

## 2017-05-05 ENCOUNTER — Encounter: Payer: Self-pay | Admitting: Family Medicine

## 2017-05-05 VITALS — BP 134/64 | HR 84 | Temp 97.8°F | Resp 16 | Wt 196.0 lb

## 2017-05-05 DIAGNOSIS — E119 Type 2 diabetes mellitus without complications: Secondary | ICD-10-CM

## 2017-05-05 DIAGNOSIS — R944 Abnormal results of kidney function studies: Secondary | ICD-10-CM

## 2017-05-05 DIAGNOSIS — M8580 Other specified disorders of bone density and structure, unspecified site: Secondary | ICD-10-CM

## 2017-05-05 DIAGNOSIS — I10 Essential (primary) hypertension: Secondary | ICD-10-CM

## 2017-05-05 LAB — POCT GLYCOSYLATED HEMOGLOBIN (HGB A1C): Hemoglobin A1C: 6.3

## 2017-05-05 NOTE — Patient Instructions (Signed)
We will call you with the lab and scan results. Do set up a mammogram.

## 2017-05-05 NOTE — Progress Notes (Signed)
Subjective:     Patient ID: Danielle Jackson, female   DOB: 15-Oct-1940, 76 y.o.   MRN: [id number]  HPI  Chief Complaint  Patient presents with  . Diabetes  . Hypertension  States she is feeling well and intends to schedule a mammogram soon. Has changed her mind about the bone density exam and wishes to schedule it. Reports a bout of cystitis treated at [facility].   Review of Systems  Respiratory: Negative for shortness of breath.   Cardiovascular: Negative for chest pain and palpitations.  Musculoskeletal:       Occasional right knee pain where she has had surgery in the past. Controls with ibuprofen and use of a knee sleeve.  Neurological: Negative for dizziness.       Objective:   Physical Exam  Constitutional: She appears well-developed and well-nourished. No distress.  Cardiovascular: Normal rate and regular rhythm.   Pulmonary/Chest: Breath sounds normal.  Musculoskeletal: She exhibits no edema (of lower extremities).       Assessment:    1. Controlled type 2 diabetes mellitus without complication, without long-term current use of insulin (HCC) - POCT glycosylated hemoglobin (Hb A1C)  2. Benign essential HTN; stable  3. Renal function test abnormal - Renal function panel  4. Osteopenia, unspecified location - DG Bone Density; Future    Plan:    Further f/u pending lab and scan results.

## 2017-05-06 LAB — RENAL FUNCTION PANEL
Albumin: 4.2 g/dL (ref 3.5–4.8)
BUN / CREAT RATIO: 20 (ref 12–28)
BUN: 19 mg/dL (ref 8–27)
CHLORIDE: 102 mmol/L (ref 96–106)
CO2: 23 mmol/L (ref 20–29)
Calcium: 9.5 mg/dL (ref 8.7–10.3)
Creatinine, Ser: 0.96 mg/dL (ref 0.57–1.00)
GFR calc non Af Amer: 58 mL/min/{1.73_m2} — ABNORMAL LOW (ref >59)
GFR, EST AFRICAN AMERICAN: 67 mL/min/{1.73_m2} (ref >59)
GLUCOSE: 129 mg/dL — AB (ref 65–99)
POTASSIUM: 4.1 mmol/L (ref 3.5–5.2)
Phosphorus: 3.6 mg/dL (ref 2.5–4.5)
Sodium: 141 mmol/L (ref 134–144)

## 2017-05-07 ENCOUNTER — Other Ambulatory Visit: Payer: Self-pay | Admitting: Family Medicine

## 2017-05-07 DIAGNOSIS — Z1231 Encounter for screening mammogram for malignant neoplasm of breast: Secondary | ICD-10-CM

## 2017-05-25 ENCOUNTER — Other Ambulatory Visit: Payer: Self-pay | Admitting: Family Medicine

## 2017-05-25 DIAGNOSIS — E119 Type 2 diabetes mellitus without complications: Secondary | ICD-10-CM

## 2017-06-15 ENCOUNTER — Telehealth: Payer: Self-pay

## 2017-06-15 ENCOUNTER — Ambulatory Visit
Admission: RE | Admit: 2017-06-15 | Discharge: 2017-06-15 | Disposition: A | Discharge status: Home / Self Care | Payer: PPO | Source: Ambulatory Visit | Attending: Family Medicine | Admitting: Family Medicine

## 2017-06-15 DIAGNOSIS — M8588 Other specified disorders of bone density and structure, other site: Secondary | ICD-10-CM | POA: Diagnosis not present

## 2017-06-15 DIAGNOSIS — Z780 Asymptomatic menopausal state: Secondary | ICD-10-CM | POA: Diagnosis not present

## 2017-06-15 DIAGNOSIS — M85851 Other specified disorders of bone density and structure, right thigh: Secondary | ICD-10-CM | POA: Diagnosis not present

## 2017-06-15 DIAGNOSIS — Z1231 Encounter for screening mammogram for malignant neoplasm of breast: Secondary | ICD-10-CM

## 2017-06-15 DIAGNOSIS — M8580 Other specified disorders of bone density and structure, unspecified site: Secondary | ICD-10-CM

## 2017-06-15 NOTE — Telephone Encounter (Signed)
Pt advised.   Thanks,   -  

## 2017-06-15 NOTE — Telephone Encounter (Signed)
-----   Message from Anola Gurney, [place] sent at 06/15/2017 12:20 PM EDT ----- You have bone thinning but to osteoporosis. Would continue Calcium 1200 mg./day with Vitamin D 800 units per day and weight bearing exercise.

## 2017-06-15 NOTE — Telephone Encounter (Signed)
-----   Message from Robert Chauvin, PA sent at 06/15/2017 12:20 PM EDT ----- You have bone thinning but to osteoporosis. Would continue Calcium 1200 mg./day with Vitamin D 800 units per day and weight bearing exercise. 

## 2017-06-22 ENCOUNTER — Other Ambulatory Visit: Payer: Self-pay | Admitting: Family Medicine

## 2017-06-22 DIAGNOSIS — E119 Type 2 diabetes mellitus without complications: Secondary | ICD-10-CM

## 2017-07-14 DIAGNOSIS — H18463 Peripheral corneal degeneration, bilateral: Secondary | ICD-10-CM | POA: Diagnosis not present

## 2017-07-14 DIAGNOSIS — E119 Type 2 diabetes mellitus without complications: Secondary | ICD-10-CM | POA: Diagnosis not present

## 2017-07-14 DIAGNOSIS — H35033 Hypertensive retinopathy, bilateral: Secondary | ICD-10-CM | POA: Diagnosis not present

## 2017-07-14 DIAGNOSIS — H52223 Regular astigmatism, bilateral: Secondary | ICD-10-CM | POA: Diagnosis not present

## 2017-07-14 LAB — HM DIABETES EYE EXAM

## 2017-07-15 ENCOUNTER — Encounter: Payer: Self-pay | Admitting: Family Medicine

## 2017-07-15 ENCOUNTER — Ambulatory Visit: Payer: PPO | Admitting: Family Medicine

## 2017-07-15 VITALS — BP 134/70 | HR 73 | Temp 97.6°F | Resp 16 | Wt 196.6 lb

## 2017-07-15 DIAGNOSIS — N3090 Cystitis, unspecified without hematuria: Secondary | ICD-10-CM | POA: Diagnosis not present

## 2017-07-15 LAB — POCT URINALYSIS DIPSTICK
BILIRUBIN UA: NEGATIVE
GLUCOSE UA: NEGATIVE
Ketones, UA: NEGATIVE
Nitrite, UA: NEGATIVE
Protein, UA: 100
UROBILINOGEN UA: 1 U/dL
pH, UA: 7 (ref 5.0–8.0)

## 2017-07-15 MED ORDER — FLUCONAZOLE 150 MG PO TABS: 150.0000 mg | ORAL_TABLET | Freq: Once | ORAL | 1 refills | Status: AC

## 2017-07-15 MED ORDER — CEPHALEXIN 500 MG PO CAPS: 500.0000 mg | ORAL_CAPSULE | Freq: Two times a day (BID) | ORAL | 0 refills | Status: DC

## 2017-07-15 NOTE — Patient Instructions (Signed)
We will call you with the culture results. Let me know if you can't tolerate the antibiotic.

## 2017-07-15 NOTE — Progress Notes (Signed)
Subjective:     Patient ID: Danielle Jackson, female   DOB: 10-25-1940, 76 y.o.   MRN: [id number] Chief Complaint  Patient presents with  . Urinary Tract Infection    Patient comes in office today with concerns of a possible urinary tract infection. Patient reports for the past two week symptoms of; frequency, urgency, incontinence, back pain, dysuria and generalized body aches. Patient has been taking otc cranberry supplement  No fever. HPI   Review of Systems     Objective:   Physical Exam  Constitutional: She appears well-developed and well-nourished. No distress.  Genitourinary:  Genitourinary Comments: No cva tenderness       Assessment:    1. Cystitis - POCT urinalysis dipstick - Urine Culture - cephALEXin (KEFLEX) 500 MG capsule; Take 1 capsule (500 mg total) 2 (two) times daily by mouth.  Dispense: 14 capsule; Refill: 0 - fluconazole (DIFLUCAN) 150 MG tablet; Take 1 tablet (150 mg total) once for 1 dose by mouth.  Dispense: 1 tablet; Refill: 1    Plan:    Further f/u pending urine culture.

## 2017-07-18 LAB — URINE CULTURE
MICRO NUMBER: 81285033
SPECIMEN QUALITY:: ADEQUATE

## 2017-07-20 ENCOUNTER — Telehealth: Payer: Self-pay

## 2017-07-20 NOTE — Telephone Encounter (Signed)
Patient advised she states that she is feeling better and has two days of antibiotic left. KW

## 2017-07-20 NOTE — Telephone Encounter (Signed)
-----   Message from Anola Gurneyobert Chauvin, [place] sent at 07/20/2017  7:40 AM EST ----- You have a bladder infection which should be sensitive to cephalexin. Are you feeling better?

## 2017-08-05 ENCOUNTER — Encounter: Payer: Self-pay | Admitting: Family Medicine

## 2017-08-05 ENCOUNTER — Ambulatory Visit: Payer: PPO | Admitting: Family Medicine

## 2017-08-05 VITALS — BP 110/74 | HR 75 | Temp 98.0°F | Resp 16 | Wt 195.4 lb

## 2017-08-05 DIAGNOSIS — I10 Essential (primary) hypertension: Secondary | ICD-10-CM | POA: Diagnosis not present

## 2017-08-05 DIAGNOSIS — E119 Type 2 diabetes mellitus without complications: Secondary | ICD-10-CM | POA: Diagnosis not present

## 2017-08-05 LAB — POCT GLYCOSYLATED HEMOGLOBIN (HGB A1C): HEMOGLOBIN A1C: 6.2

## 2017-08-05 NOTE — Patient Instructions (Signed)
Resume walking 30 minutes daily.

## 2017-08-05 NOTE — Progress Notes (Signed)
Subjective:     Patient ID: Danielle Jackson, female   DOB: 11-17-1940, 76 y.o.   MRN: [id number] Chief Complaint  Patient presents with  . Diabetes    Patient presents for a 3 month follow up. Last OV was on 05/05/17. No changes made at that visit. Patient reports good compliance with treatment plan. No concerns or issues today  . Hypertension  . Follow-up  States she feel well. HPI   Review of Systems  Respiratory: Negative for shortness of breath.   Cardiovascular: Negative for chest pain and palpitations.  Gastrointestinal:       Defers further colonoscopy screening.       Objective:   Physical Exam  Constitutional: She appears well-developed and well-nourished. No distress.  Lungs: clear Heart: RRR without murmur Lower extremities: no edema; pedal pulses intact, sensation to monofilament intact, no wounds noted.     Assessment:    1. Controlled type 2 diabetes mellitus without complication, without long-term current use of insulin (HCC) - POCT glycosylated hemoglobin (Hb A1C)  2. Benign essential HTN: continue current medication.    Plan:    Encouraged walking daily.

## 2017-09-17 ENCOUNTER — Other Ambulatory Visit: Payer: Self-pay | Admitting: Family Medicine

## 2017-09-17 DIAGNOSIS — E119 Type 2 diabetes mellitus without complications: Secondary | ICD-10-CM

## 2017-11-03 ENCOUNTER — Ambulatory Visit (INDEPENDENT_AMBULATORY_CARE_PROVIDER_SITE_OTHER): Payer: PPO | Admitting: Family Medicine

## 2017-11-03 ENCOUNTER — Encounter: Payer: Self-pay | Admitting: Family Medicine

## 2017-11-03 VITALS — BP 124/82 | HR 76 | Temp 97.5°F | Resp 15 | Wt 198.2 lb

## 2017-11-03 DIAGNOSIS — E119 Type 2 diabetes mellitus without complications: Secondary | ICD-10-CM | POA: Diagnosis not present

## 2017-11-03 LAB — POCT GLYCOSYLATED HEMOGLOBIN (HGB A1C): Hemoglobin A1C: 6.5

## 2017-11-03 NOTE — Progress Notes (Signed)
Subjective:     Patient ID: Danielle CoxBrenda S Holzheimer, female   DOB: Apr 13, 1941, 77 y.o.   MRN: [id number] Chief Complaint  Patient presents with  . Diabetes    Patient returns to office today for 3 month follow up, patient was last seen on 08/05/17 and HgbA1C in office was 6.2%. Patient reports blood sugar readings at home have been between 123-150, patient denies any hyperglycemia incidents and denies increased thirst or urination. Patient reports good compliance, tolerance and symptom control on medication.   . Hypertension    Patient comes in ofice today for 3 month follow up, last office visit was 08/05/17 blood pressure in house was 110/74   HPI States due to the weather she has been unable to walk as much.  Review of Systems  HENT:       Reports intermittent PND which she is treating with sudafed and saline irrigation. Suggested she resume Claritin. If persistent bloody/purulent drainage to call.  Respiratory: Negative for shortness of breath.   Cardiovascular: Negative for chest pain and palpitations.       Objective:   Physical Exam  Constitutional: She appears well-developed and well-nourished. No distress.  Cardiovascular: Normal rate and regular rhythm.  Pulmonary/Chest: No respiratory distress.  Musculoskeletal: She exhibits no edema.       Assessment:    1. Controlled type 2 diabetes mellitus without complication, without long-term current use of insulin (HCC) - POCT glycosylated hemoglobin (Hb A1C)    Plan:    F/u in 3 months-will update labs at that time. Call if increased sinus issues.

## 2017-11-03 NOTE — Patient Instructions (Signed)
We will update your labs at your next office visit. Resume your walking program as the weather allows.

## 2017-11-05 ENCOUNTER — Other Ambulatory Visit: Payer: Self-pay | Admitting: Family Medicine

## 2017-11-05 MED ORDER — TELMISARTAN 40 MG PO TABS: 40.0000 mg | ORAL_TABLET | Freq: Every day | ORAL | 3 refills | Status: DC

## 2017-12-10 ENCOUNTER — Ambulatory Visit (INDEPENDENT_AMBULATORY_CARE_PROVIDER_SITE_OTHER): Payer: PPO | Admitting: Family Medicine

## 2017-12-10 ENCOUNTER — Encounter: Payer: Self-pay | Admitting: Family Medicine

## 2017-12-10 VITALS — BP 110/80 | HR 69 | Temp 97.7°F | Resp 16 | Wt 199.2 lb

## 2017-12-10 DIAGNOSIS — S39012A Strain of muscle, fascia and tendon of lower back, initial encounter: Secondary | ICD-10-CM | POA: Diagnosis not present

## 2017-12-10 DIAGNOSIS — N3090 Cystitis, unspecified without hematuria: Secondary | ICD-10-CM | POA: Diagnosis not present

## 2017-12-10 LAB — POCT URINALYSIS DIPSTICK
BILIRUBIN UA: NEGATIVE
GLUCOSE UA: NEGATIVE
KETONES UA: NEGATIVE
Leukocytes, UA: NEGATIVE
Nitrite, UA: NEGATIVE
Protein, UA: 300
SPEC GRAV UA: 1.015 (ref 1.010–1.025)
Urobilinogen, UA: 0.2 E.U./dL
pH, UA: 5 (ref 5.0–8.0)

## 2017-12-10 MED ORDER — CEPHALEXIN 500 MG PO CAPS
500.0000 mg | ORAL_CAPSULE | Freq: Two times a day (BID) | ORAL | 0 refills | Status: DC
Start: 2017-12-10 — End: 2018-02-03

## 2017-12-10 MED ORDER — FLUCONAZOLE 150 MG PO TABS: 150.0000 mg | ORAL_TABLET | Freq: Once | ORAL | 0 refills | Status: AC

## 2017-12-10 NOTE — Patient Instructions (Signed)
We will call you with the urine culture results. Use Tylenol for back pain.

## 2017-12-10 NOTE — Progress Notes (Signed)
Subjective:     Patient ID: Danielle Jackson, female   DOB: April 12, 1941, 77 y.o.   MRN: [id number] Chief Complaint  Patient presents with  . Urinary Frequency    Patient comes in office today with complaints of lower back pain, urinary frequency , pelvic pressure and diarrhea for the past two weeks. Patient states that she has been taking otc Aleve, lidocaine patch, Imofdium and Pepto.    HPI States diarrhea resolved then she developed urinary sx. Has been working in her yard and may have strained her back. Reports increased non-radicular pain when changing positions.  Review of Systems     Objective:   Physical Exam  Constitutional: She appears well-developed and well-nourished. She appears distressed (mild discomfort when changing positions).  Genitourinary:  Genitourinary Comments: No cva tenderness on percussion  Musculoskeletal:  Muscle strength in lower extremities 5/5. SLR's to 60 degrees without radiation of back pain. Mildly tender in SI area R >L.       Assessment:    1. Cystitis: cephalexin + Diflucan - Urine Culture - POCT urinalysis dipstick  2. Strain of lumbar region, initial encounter     Plan:    Tylenol for back pain. Further f/u pending urine culture results.

## 2017-12-12 LAB — URINE CULTURE

## 2017-12-14 ENCOUNTER — Other Ambulatory Visit: Payer: Self-pay | Admitting: Family Medicine

## 2017-12-14 ENCOUNTER — Telehealth: Payer: Self-pay

## 2017-12-14 DIAGNOSIS — E119 Type 2 diabetes mellitus without complications: Secondary | ICD-10-CM

## 2017-12-14 NOTE — Telephone Encounter (Signed)
Patient reports that she feels that she might have diverticulitis. Patient reports symptoms of bloating, diarrhea and back pain. She states that pain in abdominal area and back is not as bad as it was but it has not improved. Patient states that she has 5 days worth of pulls left Please advise. KW

## 2017-12-14 NOTE — Telephone Encounter (Signed)
[id number] 

## 2017-12-14 NOTE — Telephone Encounter (Signed)
Come in tomorrow if not feeling better.

## 2017-12-14 NOTE — Telephone Encounter (Signed)
Patient states that she willl call office back in AM to make appt. KW

## 2017-12-14 NOTE — Telephone Encounter (Signed)
-----   Message from Anola Gurneyobert Chauvin, [place] sent at 12/14/2017  7:30 AM EDT ----- No specific organism detected. Are you feeling better on the antibiotic. You can stop it after 3-5 days.

## 2017-12-15 ENCOUNTER — Ambulatory Visit (INDEPENDENT_AMBULATORY_CARE_PROVIDER_SITE_OTHER): Payer: PPO | Admitting: Family Medicine

## 2017-12-15 ENCOUNTER — Encounter: Payer: Self-pay | Admitting: Family Medicine

## 2017-12-15 VITALS — BP 112/80 | HR 67 | Temp 97.8°F | Resp 15 | Wt 198.8 lb

## 2017-12-15 DIAGNOSIS — K5732 Diverticulitis of large intestine without perforation or abscess without bleeding: Secondary | ICD-10-CM | POA: Diagnosis not present

## 2017-12-15 MED ORDER — METRONIDAZOLE 500 MG PO TABS: 500.0000 mg | ORAL_TABLET | Freq: Three times a day (TID) | ORAL | 0 refills | Status: DC

## 2017-12-15 MED ORDER — CIPROFLOXACIN HCL 500 MG PO TABS
500.0000 mg | ORAL_TABLET | Freq: Two times a day (BID) | ORAL | 0 refills | Status: DC
Start: 2017-12-15 — End: 2017-12-23

## 2017-12-15 NOTE — Progress Notes (Signed)
Subjective:     Patient ID: Danielle Jackson, female   DOB: 12-25-1940, 77 y.o.   MRN: [id number] Chief Complaint  Patient presents with  . Abdominal Pain    Patient returns to office today for follow up from 12/10/17, patient states that she has been on antibiotic to treat for what was believed to be a UTI patient reports that symptoms have not improved. Today patient complains that lower abdominal pain is still present and radiates down to her pelvis, she reports lower back pain primarily on the right side , diarrhea, and bloating.   HPI Reports prior hx of diverticulitis. Review of 2006 colonoscopy with diverticulum throughout the large colon.  Review of Systems     Objective:   Physical Exam  Constitutional: She appears well-developed and well-nourished. No distress.  Abdominal: Soft. There is tenderness (Mild to moderate in RUQ and LLQ.). There is no guarding.       Assessment:    1. Diverticulitis of large intestine without perforation or abscess without bleeding - ciprofloxacin (CIPRO) 500 MG tablet; Take 1 tablet (500 mg total) by mouth 2 (two) times daily.  Dispense: 14 tablet; Refill: 0 - metroNIDAZOLE (FLAGYL) 500 MG tablet; Take 1 tablet (500 mg total) by mouth 3 (three) times daily.  Dispense: 21 tablet; Refill: 0    Plan:    Call if not improving. Stop cephalexin.

## 2017-12-15 NOTE — Patient Instructions (Signed)
Let me know if not feeling better. Stop cephalexin.

## 2017-12-23 ENCOUNTER — Ambulatory Visit (INDEPENDENT_AMBULATORY_CARE_PROVIDER_SITE_OTHER): Payer: PPO | Admitting: Family Medicine

## 2017-12-23 ENCOUNTER — Encounter: Payer: Self-pay | Admitting: Family Medicine

## 2017-12-23 VITALS — BP 120/72 | HR 78 | Temp 98.0°F | Resp 16 | Wt 199.0 lb

## 2017-12-23 DIAGNOSIS — K5732 Diverticulitis of large intestine without perforation or abscess without bleeding: Secondary | ICD-10-CM

## 2017-12-23 MED ORDER — CIPROFLOXACIN HCL 750 MG PO TABS: 750.0000 mg | ORAL_TABLET | Freq: Two times a day (BID) | ORAL | 0 refills | Status: DC

## 2017-12-23 NOTE — Patient Instructions (Signed)
Put your bowel to rest for a day or two with soups and clear liquids. Call me if not improving on higher dose of Cipro or fever/chills.

## 2017-12-23 NOTE — Progress Notes (Signed)
Subjective:     Patient ID: Danielle Jackson, female   DOB: Jun 27, 1941, 77 y.o.   MRN: [id number] Chief Complaint  Patient presents with  . Diverticulitis    Patient comes in today c/o abdominal pain and diarrhea. She reports that she was treated last week with Cipro and Flagyl. She reports that she usually feels much better after taking Cipro, but feels that Flagyl may have caused her to have a reaction.     HPI States she stopped the metronidazole after 3 days due to diarrhea. This abated after stopping the medication. Completed Cipro dose but reports continued lower abdominal pain but no fever. Reports she found an old allergy list which recorded intolerance to metronidazole in the past.  Review of Systems     Objective:   Physical Exam  Constitutional: She appears well-developed and well-nourished. No distress.  Abdominal: Soft. There is tenderness (left lower quadrant). There is no guarding.       Assessment:    1. Diverticulitis of large intestine without perforation or abscess without bleeding: will increase Cipro to 750 mg. Bid x 7 days. Consider imaging if fever/chills or not improving.     Plan:    Put bowel to rest with soups and clear liquids. Call if not improving or for fever.

## 2018-02-03 ENCOUNTER — Ambulatory Visit (INDEPENDENT_AMBULATORY_CARE_PROVIDER_SITE_OTHER): Payer: PPO | Admitting: Family Medicine

## 2018-02-03 ENCOUNTER — Other Ambulatory Visit: Payer: Self-pay | Admitting: Family Medicine

## 2018-02-03 ENCOUNTER — Encounter: Payer: Self-pay | Admitting: Family Medicine

## 2018-02-03 VITALS — BP 114/70 | HR 78 | Temp 97.9°F | Resp 16 | Wt 198.6 lb

## 2018-02-03 DIAGNOSIS — E119 Type 2 diabetes mellitus without complications: Secondary | ICD-10-CM | POA: Diagnosis not present

## 2018-02-03 DIAGNOSIS — E7800 Pure hypercholesterolemia, unspecified: Secondary | ICD-10-CM

## 2018-02-03 DIAGNOSIS — I10 Essential (primary) hypertension: Secondary | ICD-10-CM

## 2018-02-03 LAB — POCT GLYCOSYLATED HEMOGLOBIN (HGB A1C): HEMOGLOBIN A1C: 6.8 % — AB (ref 4.0–5.6)

## 2018-02-03 MED ORDER — LOSARTAN POTASSIUM 50 MG PO TABS: 50.0000 mg | ORAL_TABLET | Freq: Every day | ORAL | 3 refills | Status: DC

## 2018-02-03 NOTE — Patient Instructions (Signed)
Please get the labs fasting. We will call you with the lab results. 

## 2018-02-03 NOTE — Progress Notes (Signed)
  Subjective:     Patient ID: Danielle Jackson, female   DOB: [birth year], 77 y.o.   MRN: [id number] Chief Complaint  Patient presents with  . Diabetes    Patient comes in office today for 3 month follow up, patient ws last sen 11/03/17 HgbA1C in office was 6.5%. Patient reports blood sugar readings at home have been between 102-120. Patient reports good compliance and tolderand on medication  . Advice Only    Patient would like to discuss switching mer medication for Telmisartan to Losartan, patient states that current medication causes her to have headaches and nausea after taking it.    HPI We had originally switched to telmisartan due to a recall which has been resolved. Reports previouls bout of diverticulitis resolved with use of high dose Cipro.  Review of Systems  Respiratory: Negative for shortness of breath.   Cardiovascular: Negative for chest pain and palpitations.  Neurological: Negative for dizziness.       Objective:   Physical Exam  Constitutional: She appears well-developed and well-nourished. No distress.  Cardiovascular: Normal rate and regular rhythm.  Pulmonary/Chest: Breath sounds normal.  Musculoskeletal: She exhibits no edema ( pedal).       Assessment:    1. Controlled type 2 diabetes mellitus without complication, without long-term current use of insulin (HCC: controlled - POCT glycosylated hemoglobin (Hb A1C)  2. Benign essential HTN: change to Losartan 50 mg. - Comprehensive metabolic panel  3. Hypercholesteremia - Lipid panel    Plan:    Further f/u pending lab work.

## 2018-02-05 DIAGNOSIS — E7800 Pure hypercholesterolemia, unspecified: Secondary | ICD-10-CM | POA: Diagnosis not present

## 2018-02-05 DIAGNOSIS — I10 Essential (primary) hypertension: Secondary | ICD-10-CM | POA: Diagnosis not present

## 2018-02-06 LAB — COMPREHENSIVE METABOLIC PANEL
ALT: 16 IU/L (ref 0–32)
AST: 20 IU/L (ref 0–40)
Albumin/Globulin Ratio: 1.6 (ref 1.2–2.2)
Albumin: 3.9 g/dL (ref 3.5–4.8)
Alkaline Phosphatase: 69 IU/L (ref 39–117)
BILIRUBIN TOTAL: 0.4 mg/dL (ref 0.0–1.2)
BUN/Creatinine Ratio: 19 (ref 12–28)
BUN: 14 mg/dL (ref 8–27)
CHLORIDE: 107 mmol/L — AB (ref 96–106)
CO2: 19 mmol/L — ABNORMAL LOW (ref 20–29)
Calcium: 8.8 mg/dL (ref 8.7–10.3)
Creatinine, Ser: 0.75 mg/dL (ref 0.57–1.00)
GFR calc non Af Amer: 78 mL/min/{1.73_m2} (ref >59)
GFR, EST AFRICAN AMERICAN: 90 mL/min/{1.73_m2} (ref >59)
Globulin, Total: 2.4 g/dL (ref 1.5–4.5)
Glucose: 128 mg/dL — ABNORMAL HIGH (ref 65–99)
Potassium: 3.8 mmol/L (ref 3.5–5.2)
Sodium: 142 mmol/L (ref 134–144)
TOTAL PROTEIN: 6.3 g/dL (ref 6.0–8.5)

## 2018-02-06 LAB — LIPID PANEL
CHOLESTEROL TOTAL: 118 mg/dL (ref 100–199)
Chol/HDL Ratio: 3 ratio (ref 0.0–4.4)
HDL: 40 mg/dL (ref >39)
LDL Calculated: 42 mg/dL (ref 0–99)
Triglycerides: 179 mg/dL — ABNORMAL HIGH (ref 0–149)
VLDL CHOLESTEROL CAL: 36 mg/dL (ref 5–40)

## 2018-02-08 ENCOUNTER — Telehealth: Payer: Self-pay

## 2018-02-08 NOTE — Telephone Encounter (Signed)
Pt advised.   Thanks,   -  

## 2018-02-08 NOTE — Telephone Encounter (Signed)
-----   Message from Anola Gurneyobert Chauvin, [place] sent at 02/08/2018  7:35 AM EDT ----- Labs ok continue current medication.

## 2018-03-15 ENCOUNTER — Other Ambulatory Visit: Payer: Self-pay | Admitting: Family Medicine

## 2018-03-15 DIAGNOSIS — E119 Type 2 diabetes mellitus without complications: Secondary | ICD-10-CM

## 2018-05-06 ENCOUNTER — Ambulatory Visit: Payer: Self-pay | Admitting: Family Medicine

## 2018-05-11 ENCOUNTER — Ambulatory Visit (INDEPENDENT_AMBULATORY_CARE_PROVIDER_SITE_OTHER): Payer: PPO | Admitting: Family Medicine

## 2018-05-11 ENCOUNTER — Encounter: Payer: Self-pay | Admitting: Family Medicine

## 2018-05-11 VITALS — BP 118/62 | HR 76 | Temp 97.7°F | Resp 16 | Wt 197.8 lb

## 2018-05-11 DIAGNOSIS — I10 Essential (primary) hypertension: Secondary | ICD-10-CM | POA: Diagnosis not present

## 2018-05-11 DIAGNOSIS — Z9889 Other specified postprocedural states: Secondary | ICD-10-CM | POA: Insufficient documentation

## 2018-05-11 DIAGNOSIS — E119 Type 2 diabetes mellitus without complications: Secondary | ICD-10-CM

## 2018-05-11 LAB — POCT GLYCOSYLATED HEMOGLOBIN (HGB A1C): Hemoglobin A1C: 6.6 % — AB (ref 4.0–5.6)

## 2018-05-11 NOTE — Patient Instructions (Signed)
Continue regular exercise within the limits of your right knee pain.

## 2018-05-11 NOTE — Progress Notes (Signed)
  Subjective:     Patient ID: Danielle Jackson, female   DOB: 07-25-1941, 77 y.o.   MRN: [id number] Chief Complaint  Patient presents with  . Diabetes    Patient returns to office today for 3 month follow up from 02/03/18. At last visit HgbA1C was 6.8%, paitent reports that blood sugar averages between 97-145. Patient denies polydipsia, polyuria or visual changes. She reports good compliance and tolerance on medication.  . Hypertension    Patient returns fo follow up from 02/03/18 blood pressure was 114/70, patient was started back on Losartan 50mg  and reports good compliance and tolerance on medication  . Hyperlipidemia    Follow up from 02/03/18.    HPI States she feels well. Copes with occasional left knee pain with an occasional ibuprofen and the use of a knee orthotic.  Review of Systems  Respiratory: Negative for shortness of breath.   Cardiovascular: Negative for chest pain and palpitations.  Neurological: Negative for dizziness.       Objective:   Physical Exam  Constitutional: She appears well-developed and well-nourished. No distress.  Cardiovascular: Normal rate and regular rhythm.  Pulmonary/Chest: Breath sounds normal.  Musculoskeletal: She exhibits no edema (of lower extremities).       Assessment:    1. Controlled type 2 diabetes mellitus without complication, without long-term current use of insulin (HCC) - POCT glycosylated hemoglobin (Hb A1C)  2. Benign essential HTN: continue losartan    Plan:    Continue regular exercise.

## 2018-05-28 ENCOUNTER — Ambulatory Visit (INDEPENDENT_AMBULATORY_CARE_PROVIDER_SITE_OTHER): Payer: PPO | Admitting: Physician Assistant

## 2018-05-28 ENCOUNTER — Encounter: Payer: Self-pay | Admitting: Physician Assistant

## 2018-05-28 VITALS — BP 140/80 | HR 76 | Temp 97.8°F | Resp 16 | Wt 196.0 lb

## 2018-05-28 DIAGNOSIS — K5792 Diverticulitis of intestine, part unspecified, without perforation or abscess without bleeding: Secondary | ICD-10-CM | POA: Diagnosis not present

## 2018-05-28 MED ORDER — CIPROFLOXACIN HCL 750 MG PO TABS: 750.0000 mg | ORAL_TABLET | Freq: Two times a day (BID) | ORAL | 0 refills | Status: AC

## 2018-05-28 NOTE — Patient Instructions (Signed)

## 2018-05-28 NOTE — Progress Notes (Signed)
Patient: Danielle Jackson Female    DOB: August 12, 1941   77 y.o.   MRN: [id number] Visit Date: 05/28/2018  Today's Provider: Trey Sailors, PA-C   Chief Complaint  Patient presents with  . Abdominal Pain   Subjective:    HPI   Patient here today C/O possible diverticulitis worsening since last night. Patient reports left lower abdominal pain radiating to her back. Patient reports nausea, diarrhea, and chills on and off since last week. Patient reports today abdominal bloating, left lower abdominal pain and left lower back pain. Patient reports taking OTC medications for diarrhea, patient reports improvement. Patient reports taking Advil for abdominal pain last dose was last night at 10 pm. Patient reports using patch for back pain reports some pain control. She reports she was hospitalized for diverticulitis in 2003/2004. She said imaging at that time showed diverticulitis and she has had flares since. She had a colonoscopy in 2006 that showed diverticulosis, no colonoscopy since then since she says she heard that it was riskier for older people. She has had hysterectomy previously, only ovaries remain. No urinary symptoms.     Allergies  Allergen Reactions  . Amoxicillin Diarrhea  . Cefprozil   . Hydrochlorothiazide Cough  . Lovastatin     Other reaction(s): Muscle Cramps  . Medrol  [Methylprednisolone]   . Metformin Hcl Diarrhea    Severe Diarrhea  . Metronidazole     diarrhea  . Nitrofurantoin Monohyd Macro     Other reaction(s): Wheezing  . Pravastatin Sodium     Other reaction(s): Muscle Pain  . Tetracycline Hives  . Clarithromycin Rash     Current Outpatient Medications:  .  atorvastatin (LIPITOR) 10 MG tablet, TAKE ONE TABLET BY MOUTH AT BEDTIME, Disp: 90 tablet, Rfl: 1 .  Biotin 5000 MCG CAPS, Take by mouth daily., Disp: , Rfl:  .  CALCIUM CARBONATE-VIT D-MIN PO, Take by mouth 2 (two) times daily., Disp: , Rfl:  .  Cholecalciferol (VITAMIN D) 2000 UNITS  tablet, Take by mouth daily., Disp: , Rfl:  .  fluticasone (FLONASE) 50 MCG/ACT nasal spray, Place into the nose daily., Disp: , Rfl:  .  GLIPIZIDE XL 5 MG 24 hr tablet, TAKE 1 TABLET BY MOUTH ONCE DAILY (TAKE  30  MINUTES  BEFORE  THE  BIGGEST  MEAL  OF  THE  DAY), Disp: 90 tablet, Rfl: 0 .  ibuprofen (ADVIL,MOTRIN) 200 MG tablet, Take by mouth as needed., Disp: , Rfl:  .  Loratadine 10 MG CAPS, Take by mouth daily., Disp: , Rfl:  .  losartan (COZAAR) 50 MG tablet, Take 1 tablet (50 mg total) by mouth daily., Disp: 90 tablet, Rfl: 3 .  ONE TOUCH ULTRA TEST test strip, USE ONE STRIP TO CHECK GLUCOSE ONCE DAILY, Disp: 100 each, Rfl: 3 .  Probiotic Product (FORTIFY DAILY PROBIOTIC PO), Take by mouth., Disp: , Rfl:   Review of Systems  Constitutional: Positive for chills.  Respiratory: Negative.   Cardiovascular: Negative.   Gastrointestinal: Positive for abdominal distention and abdominal pain.    Social History   Tobacco Use  . Smoking status: Never Smoker  . Smokeless tobacco: Never Used  Substance Use Topics  . Alcohol use: No    Alcohol/week: 0.0 standard drinks   Objective:   BP 140/80 (BP Location: Left Arm, Patient Position: Sitting, Cuff Size: Large)   Pulse 76   Temp 97.8 F (36.6 C)   Resp 16   Wt 196  lb (88.9 kg)   SpO2 97%   BMI 33.64 kg/m  Vitals:   05/28/18 1027  BP: 140/80  Pulse: 76  Resp: 16  Temp: 97.8 F (36.6 C)  SpO2: 97%  Weight: 196 lb (88.9 kg)     Physical Exam  Constitutional: She is oriented to person, place, and time. She appears well-developed and well-nourished.  Cardiovascular: Normal rate and regular rhythm.  Pulmonary/Chest: Effort normal and breath sounds normal.  Abdominal: Soft. Normal appearance and bowel sounds are normal. There is tenderness in the left lower quadrant.  Neurological: She is alert and oriented to person, place, and time.  Skin: Skin is warm and dry.  Psychiatric: She has a normal mood and affect. Her behavior  is normal.        Assessment & Plan:     1. Diverticulitis  She declines imaging today. Notes many intolerances to common regimens for treating diverticulitis. Was previously on cipro and flagyl but reports she is intolerant of flagyl with diarrhea. She reports a previous course of only cipro cleared her symptoms. Will treat as below. She has been instructed that if she worsens, she will require imaging through the ER. She is agreeable.  - ciprofloxacin (CIPRO) 750 MG tablet; Take 1 tablet (750 mg total) by mouth 2 (two) times daily for 7 days.  Dispense: 14 tablet; Refill: 0  Return if symptoms worsen or fail to improve.  The entirety of the information documented in the History of Present Illness, Review of Systems and Physical Exam were personally obtained by me. Portions of this information were initially documented by Rondel Baton, CMA and reviewed by me for thoroughness and accuracy.        Trey Sailors, PA-C  [facility]

## 2018-06-11 ENCOUNTER — Other Ambulatory Visit: Payer: Self-pay | Admitting: Family Medicine

## 2018-06-11 DIAGNOSIS — E119 Type 2 diabetes mellitus without complications: Secondary | ICD-10-CM

## 2018-06-28 ENCOUNTER — Other Ambulatory Visit: Payer: Self-pay | Admitting: Family Medicine

## 2018-06-28 DIAGNOSIS — E119 Type 2 diabetes mellitus without complications: Secondary | ICD-10-CM

## 2018-08-02 DIAGNOSIS — H18463 Peripheral corneal degeneration, bilateral: Secondary | ICD-10-CM | POA: Diagnosis not present

## 2018-08-02 DIAGNOSIS — H35033 Hypertensive retinopathy, bilateral: Secondary | ICD-10-CM | POA: Diagnosis not present

## 2018-08-02 LAB — HM DIABETES EYE EXAM

## 2018-08-10 ENCOUNTER — Other Ambulatory Visit: Payer: Self-pay

## 2018-08-10 ENCOUNTER — Encounter: Payer: Self-pay | Admitting: Family Medicine

## 2018-08-10 ENCOUNTER — Ambulatory Visit (INDEPENDENT_AMBULATORY_CARE_PROVIDER_SITE_OTHER): Payer: PPO | Admitting: Family Medicine

## 2018-08-10 VITALS — BP 142/80 | HR 79 | Temp 97.8°F | Ht 64.0 in | Wt 196.6 lb

## 2018-08-10 DIAGNOSIS — E119 Type 2 diabetes mellitus without complications: Secondary | ICD-10-CM

## 2018-08-10 DIAGNOSIS — I10 Essential (primary) hypertension: Secondary | ICD-10-CM

## 2018-08-10 LAB — POCT GLYCOSYLATED HEMOGLOBIN (HGB A1C): HEMOGLOBIN A1C: 6.6 % — AB (ref 4.0–5.6)

## 2018-08-10 NOTE — Progress Notes (Signed)
  Subjective:     Patient ID: Danielle Jackson, female   DOB: 1940/11/12, 77 y.o.   MRN: [id number] Chief Complaint  Patient presents with  . Diabetes    3 month fup   HPI States she feels well. Just took her bp medication prior to her visit.  Review of Systems  Respiratory: Negative for shortness of breath.   Cardiovascular: Negative for chest pain and palpitations.       Objective:   Physical Exam  Constitutional: She appears well-developed and well-nourished. No distress.  Lungs: clear Heart: RRR without murmur Lower extremities: no edema; pedal pulses intact, sensation to monofilament intact, no wounds noted.     Assessment:    1. Controlled type 2 diabetes mellitus without complication, without long-term current use of insulin (HCC) - POCT glycosylated hemoglobin (Hb A1C)  2. Benign essential HTN    Plan:    She will recheck her bp at home. Consider a nurse bp check in the next week.

## 2018-08-10 NOTE — Patient Instructions (Signed)
Consider a nurse bp check in the next week.

## 2018-09-03 ENCOUNTER — Other Ambulatory Visit: Payer: Self-pay

## 2018-09-03 ENCOUNTER — Encounter: Payer: Self-pay | Admitting: Intensive Care

## 2018-09-03 ENCOUNTER — Telehealth: Payer: Self-pay | Admitting: Family Medicine

## 2018-09-03 ENCOUNTER — Ambulatory Visit (INDEPENDENT_AMBULATORY_CARE_PROVIDER_SITE_OTHER): Payer: PPO | Admitting: Family Medicine

## 2018-09-03 ENCOUNTER — Emergency Department
Admission: EM | Admit: 2018-09-03 | Discharge: 2018-09-04 | Disposition: A | Discharge status: Home / Self Care | Payer: PPO | Attending: Emergency Medicine | Admitting: Emergency Medicine

## 2018-09-03 ENCOUNTER — Encounter: Payer: Self-pay | Admitting: Family Medicine

## 2018-09-03 VITALS — BP 162/78 | HR 98 | Temp 98.1°F | Ht 64.0 in | Wt 193.8 lb

## 2018-09-03 DIAGNOSIS — Z79899 Other long term (current) drug therapy: Secondary | ICD-10-CM | POA: Diagnosis not present

## 2018-09-03 DIAGNOSIS — E119 Type 2 diabetes mellitus without complications: Secondary | ICD-10-CM | POA: Insufficient documentation

## 2018-09-03 DIAGNOSIS — L03211 Cellulitis of face: Secondary | ICD-10-CM | POA: Diagnosis not present

## 2018-09-03 DIAGNOSIS — R509 Fever, unspecified: Secondary | ICD-10-CM | POA: Insufficient documentation

## 2018-09-03 DIAGNOSIS — I10 Essential (primary) hypertension: Secondary | ICD-10-CM | POA: Diagnosis not present

## 2018-09-03 DIAGNOSIS — B349 Viral infection, unspecified: Secondary | ICD-10-CM

## 2018-09-03 DIAGNOSIS — N390 Urinary tract infection, site not specified: Secondary | ICD-10-CM | POA: Insufficient documentation

## 2018-09-03 LAB — URINALYSIS, COMPLETE (UACMP) WITH MICROSCOPIC
Bilirubin Urine: NEGATIVE
Glucose, UA: NEGATIVE mg/dL
KETONES UR: 5 mg/dL — AB
Nitrite: NEGATIVE
PH: 5 (ref 5.0–8.0)
Specific Gravity, Urine: 1.03 (ref 1.005–1.030)

## 2018-09-03 LAB — BASIC METABOLIC PANEL
ANION GAP: 10 (ref 5–15)
BUN: 17 mg/dL (ref 8–23)
CALCIUM: 9 mg/dL (ref 8.9–10.3)
CO2: 20 mmol/L — AB (ref 22–32)
CREATININE: 0.94 mg/dL (ref 0.44–1.00)
Chloride: 102 mmol/L (ref 98–111)
GFR calc non Af Amer: 59 mL/min — ABNORMAL LOW (ref >60)
Glucose, Bld: 310 mg/dL — ABNORMAL HIGH (ref 70–99)
Potassium: 4 mmol/L (ref 3.5–5.1)
Sodium: 132 mmol/L — ABNORMAL LOW (ref 135–145)

## 2018-09-03 LAB — CBC WITH DIFFERENTIAL/PLATELET
Abs Immature Granulocytes: 0.12 10*3/uL — ABNORMAL HIGH (ref 0.00–0.07)
BASOS ABS: 0 10*3/uL (ref 0.0–0.1)
Basophils Relative: 0 %
Eosinophils Absolute: 0.1 10*3/uL (ref 0.0–0.5)
Eosinophils Relative: 1 %
HEMATOCRIT: 38.1 % (ref 36.0–46.0)
Hemoglobin: 13.3 g/dL (ref 12.0–15.0)
IMMATURE GRANULOCYTES: 1 %
LYMPHS ABS: 0.5 10*3/uL — AB (ref 0.7–4.0)
LYMPHS PCT: 3 %
MCH: 32.4 pg (ref 26.0–34.0)
MCHC: 34.9 g/dL (ref 30.0–36.0)
MCV: 92.9 fL (ref 80.0–100.0)
Monocytes Absolute: 0.8 10*3/uL (ref 0.1–1.0)
Monocytes Relative: 5 %
NEUTROS ABS: 13.4 10*3/uL — AB (ref 1.7–7.7)
NEUTROS PCT: 90 %
NRBC: 0 % (ref 0.0–0.2)
Platelets: 210 10*3/uL (ref 150–400)
RBC: 4.1 MIL/uL (ref 3.87–5.11)
RDW: 12.9 % (ref 11.5–15.5)
SMEAR REVIEW: NORMAL
WBC: 14.8 10*3/uL — AB (ref 4.0–10.5)

## 2018-09-03 LAB — POCT INFLUENZA A/B
Influenza A, POC: NEGATIVE
Influenza B, POC: NEGATIVE

## 2018-09-03 LAB — LACTIC ACID, PLASMA: Lactic Acid, Venous: 1.6 mmol/L (ref 0.5–1.9)

## 2018-09-03 MED ORDER — ONDANSETRON HCL 4 MG PO TABS: 4.0000 mg | ORAL_TABLET | Freq: Once | ORAL | Status: DC

## 2018-09-03 MED ORDER — ONDANSETRON 4 MG PO TBDP
ORAL_TABLET | ORAL | Status: AC
  Filled 2018-09-03: qty 1

## 2018-09-03 MED ORDER — VANCOMYCIN HCL IN DEXTROSE 1-5 GM/200ML-% IV SOLN
1000.0000 mg | Freq: Once | INTRAVENOUS | Status: AC
  Administered 2018-09-03: 1000 mg via INTRAVENOUS
  Filled 2018-09-03: qty 200

## 2018-09-03 MED ORDER — CLINDAMYCIN HCL 300 MG PO CAPS: 300.0000 mg | ORAL_CAPSULE | Freq: Four times a day (QID) | ORAL | 0 refills | Status: DC

## 2018-09-03 MED ORDER — CEPHALEXIN 500 MG PO CAPS: 500.0000 mg | ORAL_CAPSULE | Freq: Three times a day (TID) | ORAL | 0 refills | Status: DC

## 2018-09-03 MED ORDER — SODIUM CHLORIDE 0.9 % IV BOLUS
1000.0000 mL | Freq: Once | INTRAVENOUS | Status: AC
  Administered 2018-09-03: 1000 mL via INTRAVENOUS

## 2018-09-03 MED ORDER — ONDANSETRON 4 MG PO TBDP
4.0000 mg | ORAL_TABLET | Freq: Once | ORAL | Status: AC
Start: 2018-09-03 — End: 2018-09-03
  Administered 2018-09-03: 4 mg via ORAL

## 2018-09-03 NOTE — ED Triage Notes (Addendum)
Patient reports a painful rash with nausea and fever on her face that started yesterday and saw her PCP today who diagnosed her with cellulitis. Patient reports her whole face hurts and is painful to touch. Redness noted to Right side of face and nose. Denies SOB or CP or feeling like something is in her throat. Patient also c/o fever that started yesterday and last took advil at 1:30pm. Highest fever at home 102. Patient started on clindamycin today

## 2018-09-03 NOTE — Telephone Encounter (Signed)
Please review

## 2018-09-03 NOTE — Discharge Instructions (Signed)
Please seek medical attention for any high fevers, chest pain, shortness of breath, change in behavior, persistent vomiting, bloody stool or any other new or concerning symptoms.  

## 2018-09-03 NOTE — ED Provider Notes (Signed)
[facility] Emergency Department Provider Note   ____________________________________________   I have reviewed the triage vital signs and the nursing notes.   HISTORY  Chief Complaint Cellulitis   History limited by: Not Limited   HPI Danielle Jackson is a 78 y.o. female who presents to the emergency department today because of concern for cellulitis to her face, nausea and fever. The patient states she actually has had fever and chills for the past few days. She only noticed redness to the right side of her face today. She has been having some sinus discomfort and nasal drainage. The patient states that she went to her doctors today and was diagnosed with cellulitis. When she took the first dose of the antibiotic however she started becoming nauseas. She states she was tested for flu at the PCPs office and it was negative. She denies any known sick contacts.     Per medical record review patient has a history of DM, HLD, HTN.   Past Medical History:  Diagnosis Date  . Diabetes mellitus without complication (HCC)   . Hyperlipidemia   . Hypertension     Patient Active Problem List   Diagnosis Date Noted  . S/P arthroscopic surgery of right knee 05/11/2018  . Allergic rhinitis 01/27/2015  . Benign essential HTN 01/27/2015  . Controlled diabetes mellitus type II without complication (HCC) 01/27/2015  . Diverticulosis of colon 01/27/2015  . Hypercholesteremia 01/27/2015  . Adiposity 01/27/2015  . Osteopenia 01/27/2015  . Avitaminosis D 01/27/2015    Past Surgical History:  Procedure Laterality Date  . ABDOMINAL HYSTERECTOMY  1975  . CATARACT EXTRACTION  03/01/2001  . KNEE SURGERY  03/24/2014    Prior to Admission medications   Medication Sig Start Date End Date Taking? Authorizing Provider  atorvastatin (LIPITOR) 10 MG tablet TAKE ONE TABLET BY MOUTH AT BEDTIME 09/17/17   Anola Gurneyhauvin, Robert, PA  Biotin 5000 MCG CAPS Take by mouth daily.    [provider]  CALCIUM CARBONATE-VIT D-MIN PO Take by mouth 2 (two) times daily.    [provider]  Cholecalciferol (VITAMIN D) 2000 UNITS tablet Take by mouth daily.    [provider]  clindamycin (CLEOCIN) 300 MG capsule Take 1 capsule (300 mg total) by mouth 4 (four) times daily. 09/03/18   Anola Gurneyhauvin, Robert, PA  fluticasone (FLONASE) 50 MCG/ACT nasal spray Place into the nose daily.    [provider]  glipiZIDE (GLUCOTROL XL) 5 MG 24 hr tablet TAKE 1 TABLET BY MOUTH ONCE DAILY. *TAKE 30 MINUTES BEFORE THE BIGGEST MEAL OF THE DAY* 06/11/18   Anola Gurneyhauvin, Robert, PA  ibuprofen (ADVIL,MOTRIN) 200 MG tablet Take by mouth as needed.    [provider]  Loratadine 10 MG CAPS Take by mouth daily. 05/20/11   [provider]  losartan (COZAAR) 50 MG tablet Take 1 tablet (50 mg total) by mouth daily. 02/03/18   Anola Gurneyhauvin, Robert, PA  ONE TOUCH ULTRA TEST test strip USE 1 STRIP TO CHECK GLUCOSE ONCE DAILY 06/28/18   Anola Gurneyhauvin, Robert, PA  Probiotic Product (FORTIFY DAILY PROBIOTIC PO) Take by mouth.    [provider]    Allergies Amoxicillin; Cefprozil; Hydrochlorothiazide; Lovastatin; Medrol  [methylprednisolone]; Metformin hcl; Metronidazole; Nitrofurantoin monohyd macro; Pravastatin sodium; Tetracycline; and Clarithromycin  Family History  Problem Relation Age of Onset  . Diabetes Mother   . Heart disease Mother   . Hyperlipidemia Mother   . Hypertension Mother   . Arthritis Mother   . Mental  illness Mother   . Asthma Father   . Cancer Father   . Hypertension Sister   . Asthma Brother     Social History Social History   Tobacco Use  . Smoking status: Never Smoker  . Smokeless tobacco: Never Used  Substance Use Topics  . Alcohol use: No    Alcohol/week: 0.0 standard drinks  . Drug use: No    Review of Systems Constitutional: Positive for fever and chills.  Eyes: No visual changes. ENT: No sore throat. Cardiovascular: Denies chest  pain. Respiratory: Denies shortness of breath. Gastrointestinal: No abdominal pain.  Positive for nausea.  Genitourinary: Negative for dysuria. Musculoskeletal: Negative for back pain. Skin: Positive for rash to right side of her face.  Neurological: Negative for headaches, focal weakness or numbness.  ____________________________________________   PHYSICAL EXAM:  VITAL SIGNS: ED Triage Vitals  Enc Vitals Group     BP 09/03/18 1616 (!) 169/80     Pulse Rate 09/03/18 1616 (!) 110     Resp 09/03/18 1616 18     Temp 09/03/18 1616 98.4 F (36.9 C)     Temp Source 09/03/18 1616 Oral     SpO2 09/03/18 1616 97 %     Weight 09/03/18 1615 193 lb (87.5 kg)     Height 09/03/18 1615 5\' 4"  (1.626 m)     Head Circumference --      Peak Flow --      Pain Score 09/03/18 1621 5   Constitutional: Alert and oriented.  Eyes: Conjunctivae are normal.  ENT      Head: Normocephalic and atraumatic.      Nose: No congestion/rhinnorhea.      Mouth/Throat: Mucous membranes are moist.      Neck: No stridor. Hematological/Lymphatic/Immunilogical: No cervical lymphadenopathy. Cardiovascular: Normal rate, regular rhythm.  No murmurs, rubs, or gallops. Respiratory: Normal respiratory effort without tachypnea nor retractions. Breath sounds are clear and equal bilaterally. No wheezes/rales/rhonchi. Gastrointestinal: Soft and non tender. No rebound. No guarding.  Genitourinary: Deferred Musculoskeletal: Normal range of motion in all extremities. No lower extremity edema. Neurologic:  Normal speech and language. No gross focal neurologic deficits are appreciated.  Skin:  Erythema and warmth noted over the right cheek with some extension down the neck. No fluctuance.  Psychiatric: Mood and affect are normal. Speech and behavior are normal. Patient exhibits appropriate insight and judgment.  ____________________________________________    LABS (pertinent positives/negatives)  CBC wbc 14.8, hgb 13.3, plt  210 BMP na 132, k 4.0, glu 310, cr 0.94 Lactic 1.6 UA cloudy, moderate leukocytes, >50 wbc ____________________________________________   EKG  None  ____________________________________________    RADIOLOGY  None  ____________________________________________   PROCEDURES  Procedures  ____________________________________________   INITIAL IMPRESSION / ASSESSMENT AND PLAN / ED COURSE  Pertinent labs & imaging results that were available during my care of the patient were reviewed by me and considered in my medical decision making (see chart for details).   Patient presented to the emergency department today with concerns for cellulitis and some nausea when taking antibiotic prescribed by primary care doctor.  The patient sounds like she is actually had some fevers and chills a few days prior to this rash erupting on her face.  Given that she did have a fever earlier I did send a urinalysis.  This did come back positive for UTI.  Patient's blood work had a mild leukocytosis however no elevated lactic acidosis.  This point I do think lower urinary tract infection likely  the primary driver the patient's fever.  Discussed this with the patient.  Will plan on giving IV Rocephin.  She states that she has had cephalosporins in the past although has had some diarrhea but no other allergic reaction to them.  Discussed with the patient she did feel comfortable going home and I do think this is reasonable.  Patient without any confusion.  Did discuss return precautions.  ____________________________________________   FINAL CLINICAL IMPRESSION(S) / ED DIAGNOSES  Final diagnoses:  Lower urinary tract infection  Fever, unspecified fever cause     Note: This dictation was prepared with Dragon dictation. Any transcriptional errors that result from this process are unintentional     Phineas SemenGoodman, , MD 09/03/18 2317

## 2018-09-03 NOTE — Telephone Encounter (Signed)
Let her know that she may wish to try the antibiotic for 4 doses first but ok to go to ER if she is worried

## 2018-09-03 NOTE — Telephone Encounter (Signed)
Pt letting Nadine Counts know her fever come back to 100.4 - nausea - rash on nose is spreading.  Pt is going to ER as soon as husband comes back.  Thanks, [employer]

## 2018-09-03 NOTE — Patient Instructions (Signed)
Continue expectorants like Mucinex and saline spray for congestion. Report to the ER if fever persists and facial infection spreads.

## 2018-09-03 NOTE — Telephone Encounter (Signed)
Left detailed vm and advised pt about taking antibiotics and going to Er.  dbs

## 2018-09-03 NOTE — Progress Notes (Signed)
  Subjective:     Patient ID: Danielle Jackson, female   DOB: 10/06/1940, 78 y.o.   MRN: [id number] Chief Complaint  Patient presents with  . Sinusitis    congested, fever 102 @ 1 am, 101.6 @ 4am 09/02/18, chills, body aches   HPI Reports onset of viral sx 12/30. Reports sinus pressure, headache, and bloody PND with minimal cough.  Subsequently in the last 24 hours developed high fevers and right sided, tender, facial and nasal rash. No hx of or exposure to MRSA.  Review of Systems     Objective:   Physical Exam Constitutional:      General: She is not in acute distress.    Appearance: She is ill-appearing.  Skin:    Comments: Confluent, tender, erythematous rash to the right side of her face extending to the base of her neck. Nose is also erythematous.  Neurological:     Mental Status: She is alert.   Ears: T.M's intact without inflammation Throat: tonsils absent Neck: no cervical adenopathy Lungs: clear     Assessment:    1. Acute viral syndrome - POCT Influenza A/B  2. Cellulitis of face: will start clindamycin due to multiple intolerances. States she has tolerated azithromycin in the past.    Plan:   Discussed otc medication. Will f/u on 1/6. She is to report to the ER for worsening rash or fever persists.

## 2018-09-04 NOTE — ED Notes (Signed)
Patient verbalized understanding of discharge instructions, no questions. Patient ambulated out of ED with steady gait in no distress.  

## 2018-09-05 LAB — URINE CULTURE
Culture: NO GROWTH
Special Requests: NORMAL

## 2018-09-06 ENCOUNTER — Encounter: Payer: Self-pay | Admitting: Family Medicine

## 2018-09-06 ENCOUNTER — Ambulatory Visit (INDEPENDENT_AMBULATORY_CARE_PROVIDER_SITE_OTHER): Payer: PPO | Admitting: Family Medicine

## 2018-09-06 ENCOUNTER — Other Ambulatory Visit: Payer: Self-pay | Admitting: Family Medicine

## 2018-09-06 ENCOUNTER — Other Ambulatory Visit: Payer: Self-pay

## 2018-09-06 VITALS — BP 130/78 | HR 82 | Temp 97.5°F | Ht 64.0 in | Wt 195.4 lb

## 2018-09-06 DIAGNOSIS — L03211 Cellulitis of face: Secondary | ICD-10-CM | POA: Diagnosis not present

## 2018-09-06 DIAGNOSIS — E119 Type 2 diabetes mellitus without complications: Secondary | ICD-10-CM

## 2018-09-06 MED ORDER — GLIPIZIDE ER 5 MG PO TB24: ORAL_TABLET | ORAL | 0 refills | Status: DC

## 2018-09-06 NOTE — Progress Notes (Signed)
  Subjective:     Patient ID: Danielle Jackson, female   DOB: September 03, 1940, 78 y.o.   MRN: [id number] Chief Complaint  Patient presents with  . Follow-up    UTI, URI, cellulitis   HPI Seen in the ER 1/3 after she could not tolerate clindamycin due to nausea for facial cellulitis. Currently doing well on cephalexin. U/A suggestive of UTI but no growth on culture.  Review of Systems     Objective:   Physical Exam Constitutional:      General: She is not in acute distress.    Appearance: She is not ill-appearing.  Skin:    Comments: Right face with fading erythema/ mild  Skin sloughing. Nose erythema has resolved.  Neurological:     Mental Status: She is alert.        Assessment:    1. Cellulitis of face: resolving    Plan:    Complete 10 day course of cephalexin.

## 2018-09-06 NOTE — Patient Instructions (Addendum)
Complete antibiotic as prescribed. Let me know if new symptoms.

## 2018-09-06 NOTE — Telephone Encounter (Signed)
Pt has appointment at 4pm 09/06/18 today.  dbs

## 2018-11-16 ENCOUNTER — Ambulatory Visit: Payer: PPO | Admitting: Family Medicine

## 2018-11-24 ENCOUNTER — Other Ambulatory Visit: Payer: Self-pay

## 2018-11-24 DIAGNOSIS — E119 Type 2 diabetes mellitus without complications: Secondary | ICD-10-CM

## 2018-11-25 MED ORDER — LOSARTAN POTASSIUM 50 MG PO TABS: 50.0000 mg | ORAL_TABLET | Freq: Every day | ORAL | 3 refills | Status: DC

## 2018-11-25 MED ORDER — GLIPIZIDE ER 5 MG PO TB24: ORAL_TABLET | ORAL | 0 refills | Status: DC

## 2018-11-25 MED ORDER — ATORVASTATIN CALCIUM 10 MG PO TABS: 10.0000 mg | ORAL_TABLET | Freq: Every day | ORAL | 1 refills | Status: DC

## 2018-11-30 ENCOUNTER — Ambulatory Visit (INDEPENDENT_AMBULATORY_CARE_PROVIDER_SITE_OTHER): Payer: PPO | Admitting: Family Medicine

## 2018-11-30 ENCOUNTER — Encounter: Payer: Self-pay | Admitting: Family Medicine

## 2018-11-30 ENCOUNTER — Other Ambulatory Visit: Payer: Self-pay

## 2018-11-30 VITALS — BP 162/70 | HR 84 | Temp 97.7°F | Resp 16 | Wt 196.0 lb

## 2018-11-30 DIAGNOSIS — E119 Type 2 diabetes mellitus without complications: Secondary | ICD-10-CM

## 2018-11-30 DIAGNOSIS — I10 Essential (primary) hypertension: Secondary | ICD-10-CM

## 2018-11-30 DIAGNOSIS — E7800 Pure hypercholesterolemia, unspecified: Secondary | ICD-10-CM | POA: Diagnosis not present

## 2018-11-30 LAB — POCT GLYCOSYLATED HEMOGLOBIN (HGB A1C): Hemoglobin A1C: 6.8 % — AB (ref 4.0–5.6)

## 2018-11-30 NOTE — Progress Notes (Signed)
Danielle Jackson  MRN: [id number] DOB: June 12, 1941  Subjective:  HPI   The patient is a 78 year old female who presents today for follow up from previous provider who has recently retired.   She is to establish with new provider in our office.  The patient has been seen for acute visits since her last visit for diabetes on 08/10/18.  Her A1C at that time was 6.6.  The patient checks her glucose at home and has been getting readings between 120-160.  She denies any signs or symptoms of hypoglycemia.  Patient Active Problem List   Diagnosis Date Noted  . S/P arthroscopic surgery of right knee 05/11/2018  . Allergic rhinitis 01/27/2015  . Benign essential HTN 01/27/2015  . Controlled diabetes mellitus type II without complication (HCC) 01/27/2015  . Diverticulosis of colon 01/27/2015  . Hypercholesteremia 01/27/2015  . Adiposity 01/27/2015  . Osteopenia 01/27/2015  . Avitaminosis D 01/27/2015   Past Medical History:  Diagnosis Date  . Diabetes mellitus without complication (HCC)   . Hyperlipidemia   . Hypertension    Past Surgical History:  Procedure Laterality Date  . ABDOMINAL HYSTERECTOMY  1975  . CATARACT EXTRACTION  03/01/2001  . KNEE SURGERY  03/24/2014   Family History  Problem Relation Age of Onset  . Diabetes Mother   . Heart disease Mother   . Hyperlipidemia Mother   . Hypertension Mother   . Arthritis Mother   . Mental illness Mother   . Asthma Father   . Cancer Father   . Hypertension Sister   . Asthma Brother    Social History   Socioeconomic History  . Marital status: Married    Spouse name: Not on file  . Number of children: Not on file  . Years of education: Not on file  . Highest education level: Not on file  Occupational History  . Not on file  Social Needs  . Financial resource strain: Not on file  . Food insecurity:    Worry: Not on file    Inability: Not on file  . Transportation needs:    Medical: Not on file    Non-medical: Not on file   Tobacco Use  . Smoking status: Never Smoker  . Smokeless tobacco: Never Used  Substance and Sexual Activity  . Alcohol use: No    Alcohol/week: 0.0 standard drinks  . Drug use: No  . Sexual activity: Not on file  Lifestyle  . Physical activity:    Days per week: Not on file    Minutes per session: Not on file  . Stress: Not on file  Relationships  . Social connections:    Talks on phone: Not on file    Gets together: Not on file    Attends religious service: Not on file    Active member of club or organization: Not on file    Attends meetings of clubs or organizations: Not on file    Relationship status: Not on file  . Intimate partner violence:    Fear of current or ex partner: Not on file    Emotionally abused: Not on file    Physically abused: Not on file    Forced sexual activity: Not on file  Other Topics Concern  . Not on file  Social History Narrative  . Not on file   Outpatient Encounter Medications as of 11/30/2018  Medication Sig Note  . atorvastatin (LIPITOR) 10 MG tablet Take 1 tablet (10 mg total) by mouth  at bedtime.   Marland Kitchen CALCIUM CARBONATE-VIT D-MIN PO Take by mouth 2 (two) times daily. 01/27/2015: Received from: [facility] Received Sig:   . fluticasone (FLONASE) 50 MCG/ACT nasal spray Place into the nose daily. 01/27/2015: Received from: [facility] Received Sig:   . glipiZIDE (GLUCOTROL XL) 5 MG 24 hr tablet TAKE 1 TABLET BY MOUTH ONCE DAILY. *TAKE 30 MINUTES BEFORE THE BIGGEST MEAL OF THE DAY*   . ibuprofen (ADVIL,MOTRIN) 200 MG tablet Take by mouth as needed. 01/27/2015: Medication taken as needed.  Received from: [facility] Received Sig:   . Loratadine 10 MG CAPS Take by mouth daily. 01/27/2015: Received from: [facility] Received Sig:   . losartan (COZAAR) 50 MG tablet Take 1 tablet (50 mg total) by mouth daily.   . ONE TOUCH ULTRA TEST test strip USE 1 STRIP TO CHECK GLUCOSE ONCE DAILY    . Probiotic Product (FORTIFY DAILY PROBIOTIC PO) Take by mouth.   . Biotin 5000 MCG CAPS Take by mouth daily. 01/27/2015: Received from: [facility] Received Sig:   . Cholecalciferol (VITAMIN D) 2000 UNITS tablet Take by mouth daily. 01/27/2015: Received from: [facility] Received Sig:   . [DISCONTINUED] cephALEXin (KEFLEX) 500 MG capsule Take 1 capsule (500 mg total) by mouth 3 (three) times daily.   . [DISCONTINUED] clindamycin (CLEOCIN) 300 MG capsule Take 1 capsule (300 mg total) by mouth 4 (four) times daily. (Patient not taking: Reported on 09/06/2018)    No facility-administered encounter medications on file as of 11/30/2018.    Allergies  Allergen Reactions  . Amoxicillin Diarrhea  . Cefprozil   . Hydrochlorothiazide Cough  . Lovastatin     Other reaction(s): Muscle Cramps  . Medrol  [Methylprednisolone]   . Metformin Hcl Diarrhea    Severe Diarrhea  . Metronidazole     diarrhea  . Nitrofurantoin Monohyd Macro     Other reaction(s): Wheezing  . Pravastatin Sodium     Other reaction(s): Muscle Pain  . Tetracycline Hives  . Clarithromycin Rash    Review of Systems  Constitutional: Negative for fever and malaise/fatigue.  Respiratory: Negative for cough, shortness of breath and wheezing.   Cardiovascular: Negative for chest pain, palpitations, orthopnea, claudication and leg swelling.  Genitourinary: Negative for frequency.  Endo/Heme/Allergies: Negative for polydipsia.    Objective:  BP (!) 162/70 (BP Location: Right Arm, Patient Position: Sitting, Cuff Size: Normal)   Pulse 84   Temp 97.7 F (36.5 C) (Oral)   Resp 16   Wt 196 lb (88.9 kg)   SpO2 99%   BMI 33.64 kg/m   BP Readings from Last 3 Encounters:  11/30/18 (!) 162/70  09/06/18 130/78  09/04/18 (!) 144/76   Wt Readings from Last 3 Encounters:  11/30/18 196 lb (88.9 kg)  09/06/18 195 lb 6.4 oz (88.6 kg)  09/03/18 193 lb (87.5 kg)   Physical Exam  Constitutional: She  is oriented to person, place, and time and well-developed, well-nourished, and in no distress.  HENT:  Head: Normocephalic and atraumatic.  Eyes: Conjunctivae and EOM are normal.  Neck: Neck supple. No thyromegaly present.  Cardiovascular: Normal rate and regular rhythm.  Pulmonary/Chest: Effort normal and breath sounds normal.  Abdominal: Soft. Bowel sounds are normal.  Lymphadenopathy:    She has no cervical adenopathy.  Neurological: She is alert and oriented to person, place, and time.  Skin: No rash noted.  Psychiatric: Memory and affect normal.   Diabetic Foot Form - Detailed  Diabetic Foot Exam - detailed Diabetic Foot exam was performed with the following findings:  Yes 11/30/2018 11:43 AM  Visual Foot Exam completed.:  Yes  Can the patient see the bottom of their feet?:  Yes Are the shoes appropriate in style and fit?:  Yes Is there swelling or and abnormal foot shape?:  No Is there a claw toe deformity?:  No Is there elevated skin temparature?:  No Is there foot or ankle muscle weakness?:  No Normal Range of Motion:  Yes Pulse Foot Exam completed.:  Yes  Right posterior Tibialias:  Present Left posterior Tibialias:  Present  Right Dorsalis Pedis:  Present Left Dorsalis Pedis:  Present  Sensory Foot Exam Completed.:  Yes Semmes-Weinstein Monofilament Test R Site 1-Great Toe:  Pos L Site 1-Great Toe:  Pos         Assessment and Plan :  1. Controlled type 2 diabetes mellitus without complication, without long-term current use of insulin (HCC) FBS 120 yesterday and 150 at home today. Exercise difficult with arthritis and high pollen levels flaring allergies. Still taking the Glipizide 5 mg qd. Hgb A1C 6.8% today. Ophthalmology exam up to date and foot exam today is normal. Continue present regimen and get CBC with CMP. Presently on a statin and ARB. Consider AWE in 6 months. - POCT glycosylated hemoglobin (Hb A1C) - CBC with Differential/Platelet - Comprehensive  metabolic panel  2. Benign essential HTN BP elevated initially but came down to 156/76 at rest. Still taking the Losartan 50 mg qd regularly. Recommend limiting salt intake and recheck follow up labs.  - CBC with Differential/Platelet - Comprehensive metabolic panel - TSH  3. Hypercholesteremia Tolerating the Lipitor 10 mg hs and trying to follow a low fat diabetic diet. Works in the yard and tries to walk for exercise (as her right knee arthritis allows). Will recheck labs and follow up pending reports. - Comprehensive metabolic panel - Lipid panel - TSH

## 2018-12-06 ENCOUNTER — Other Ambulatory Visit: Payer: Self-pay | Admitting: Family Medicine

## 2018-12-06 DIAGNOSIS — E119 Type 2 diabetes mellitus without complications: Secondary | ICD-10-CM

## 2018-12-06 NOTE — Telephone Encounter (Signed)
Please Review

## 2019-01-21 DIAGNOSIS — I10 Essential (primary) hypertension: Secondary | ICD-10-CM | POA: Diagnosis not present

## 2019-01-21 DIAGNOSIS — E119 Type 2 diabetes mellitus without complications: Secondary | ICD-10-CM | POA: Diagnosis not present

## 2019-01-21 DIAGNOSIS — E7800 Pure hypercholesterolemia, unspecified: Secondary | ICD-10-CM | POA: Diagnosis not present

## 2019-01-22 LAB — CBC WITH DIFFERENTIAL/PLATELET
Basophils Absolute: 0 10*3/uL (ref 0.0–0.2)
Basos: 0 %
EOS (ABSOLUTE): 0.2 10*3/uL (ref 0.0–0.4)
Eos: 2 %
Hematocrit: 41.3 % (ref 34.0–46.6)
Hemoglobin: 14 g/dL (ref 11.1–15.9)
Immature Grans (Abs): 0 10*3/uL (ref 0.0–0.1)
Immature Granulocytes: 1 %
Lymphocytes Absolute: 2 10*3/uL (ref 0.7–3.1)
Lymphs: 31 %
MCH: 33 pg (ref 26.6–33.0)
MCHC: 33.9 g/dL (ref 31.5–35.7)
MCV: 97 fL (ref 79–97)
Monocytes Absolute: 0.5 10*3/uL (ref 0.1–0.9)
Monocytes: 8 %
Neutrophils Absolute: 3.8 10*3/uL (ref 1.4–7.0)
Neutrophils: 58 %
Platelets: 200 10*3/uL (ref 150–450)
RBC: 4.24 x10E6/uL (ref 3.77–5.28)
RDW: 13.4 % (ref 11.7–15.4)
WBC: 6.6 10*3/uL (ref 3.4–10.8)

## 2019-01-22 LAB — COMPREHENSIVE METABOLIC PANEL
ALBUMIN: 4.1 g/dL (ref 3.7–4.7)
ALT: 21 IU/L (ref 0–32)
AST: 20 IU/L (ref 0–40)
Albumin/Globulin Ratio: 1.6 (ref 1.2–2.2)
Alkaline Phosphatase: 76 IU/L (ref 39–117)
BUN/Creatinine Ratio: 15 (ref 12–28)
BUN: 14 mg/dL (ref 8–27)
Bilirubin Total: 0.3 mg/dL (ref 0.0–1.2)
CHLORIDE: 104 mmol/L (ref 96–106)
CO2: 20 mmol/L (ref 20–29)
Calcium: 9.5 mg/dL (ref 8.7–10.3)
Creatinine, Ser: 0.93 mg/dL (ref 0.57–1.00)
GFR calc Af Amer: 69 mL/min/{1.73_m2} (ref >59)
GFR calc non Af Amer: 60 mL/min/{1.73_m2} (ref >59)
GLUCOSE: 138 mg/dL — AB (ref 65–99)
Globulin, Total: 2.5 g/dL (ref 1.5–4.5)
Potassium: 4.2 mmol/L (ref 3.5–5.2)
Sodium: 139 mmol/L (ref 134–144)
Total Protein: 6.6 g/dL (ref 6.0–8.5)

## 2019-01-22 LAB — TSH: TSH: 2.79 u[IU]/mL (ref 0.450–4.500)

## 2019-01-22 LAB — LIPID PANEL
Chol/HDL Ratio: 4 ratio (ref 0.0–4.4)
Cholesterol, Total: 167 mg/dL (ref 100–199)
HDL: 42 mg/dL (ref >39)
LDL Calculated: 62 mg/dL (ref 0–99)
Triglycerides: 314 mg/dL — ABNORMAL HIGH (ref 0–149)
VLDL CHOLESTEROL CAL: 63 mg/dL — AB (ref 5–40)

## 2019-01-25 ENCOUNTER — Telehealth: Payer: Self-pay

## 2019-01-25 NOTE — Telephone Encounter (Signed)
LMTCB ED 

## 2019-01-25 NOTE — Telephone Encounter (Signed)
-----   Message from Tamsen Roers, [place] sent at 01/25/2019  9:42 AM EDT ----- Blood cell counts back to normal. Blood sugar much improved compared to 4 months ago. Normal liver, thyroid and kidney function. Triglycerides very high with normal total cholesterol. Continue present medications, lower fats in diet, exercise by walking regularly and may take Flush-Free Niacin 250 mg qd. Recheck labs in 3-4 months.

## 2019-01-25 NOTE — Telephone Encounter (Signed)
Advised 

## 2019-06-06 ENCOUNTER — Other Ambulatory Visit: Payer: Self-pay

## 2019-06-06 ENCOUNTER — Ambulatory Visit (INDEPENDENT_AMBULATORY_CARE_PROVIDER_SITE_OTHER): Payer: PPO | Admitting: Family Medicine

## 2019-06-06 ENCOUNTER — Encounter: Payer: Self-pay | Admitting: Family Medicine

## 2019-06-06 VITALS — BP 170/74 | HR 83 | Temp 97.1°F | Ht 64.0 in | Wt 197.0 lb

## 2019-06-06 DIAGNOSIS — M8580 Other specified disorders of bone density and structure, unspecified site: Secondary | ICD-10-CM

## 2019-06-06 DIAGNOSIS — E119 Type 2 diabetes mellitus without complications: Secondary | ICD-10-CM | POA: Diagnosis not present

## 2019-06-06 DIAGNOSIS — J301 Allergic rhinitis due to pollen: Secondary | ICD-10-CM

## 2019-06-06 DIAGNOSIS — Z0000 Encounter for general adult medical examination without abnormal findings: Secondary | ICD-10-CM | POA: Diagnosis not present

## 2019-06-06 DIAGNOSIS — N390 Urinary tract infection, site not specified: Secondary | ICD-10-CM | POA: Diagnosis not present

## 2019-06-06 DIAGNOSIS — I10 Essential (primary) hypertension: Secondary | ICD-10-CM | POA: Diagnosis not present

## 2019-06-06 DIAGNOSIS — E7800 Pure hypercholesterolemia, unspecified: Secondary | ICD-10-CM | POA: Diagnosis not present

## 2019-06-06 LAB — POCT URINALYSIS DIPSTICK
Bilirubin, UA: NEGATIVE
Glucose, UA: NEGATIVE
Ketones, UA: NEGATIVE
Nitrite, UA: NEGATIVE
Protein, UA: POSITIVE — AB
Spec Grav, UA: 1.025 (ref 1.010–1.025)
Urobilinogen, UA: 0.2 E.U./dL
pH, UA: 6 (ref 5.0–8.0)

## 2019-06-06 MED ORDER — CIPROFLOXACIN HCL 250 MG PO TABS: 250.0000 mg | ORAL_TABLET | Freq: Two times a day (BID) | ORAL | 0 refills | Status: DC

## 2019-06-06 MED ORDER — ATORVASTATIN CALCIUM 10 MG PO TABS: 10.0000 mg | ORAL_TABLET | Freq: Every day | ORAL | 3 refills | Status: DC

## 2019-06-06 NOTE — Progress Notes (Signed)
Patient: Danielle Jackson, Female    DOB: [birth year], 78 y.o.   MRN: [id number] Visit Date: 06/06/2019  Today's Provider: Vernie Murders, PA   Chief Complaint  Patient presents with  . Annual Exam   Subjective:  Danielle Jackson is a 78 y.o. female who presents today for health maintenance and complete physical. She feels some recent flare of seasonal allergies with PND and scratchy throat without fever or cough. Also, having bladder pressure without hematuria the past couple days. She reports exercising occasionally with walking.. She reports she is sleeping fairly well.  Immunization History  Administered Date(s) Administered  . Influenza Split 06/25/2010, 05/20/2011  . Influenza, High Dose Seasonal PF 06/06/2014, 06/08/2015, 06/03/2016, 06/24/2017  . Influenza,inj,Quad PF,6+ Mos 05/03/2013  . Influenza-Unspecified 06/01/2014, 06/03/2016, 07/01/2018  . Pneumococcal Conjugate-13 06/06/2014  . Pneumococcal Polysaccharide-23 07/03/2007  . Td 08/02/2004  . Tdap 05/20/2011  . Zoster 01/04/2013   03/25/05 Colonoscopy 06/15/17 BMD-Osteoporosis 06/15/17 Mammogram-Negative 06/06/14 Pap Smear-Normal 01/21/19 Lipids, TSH, Met C, CBC 11/30/18 HbgA1C-6.8 08/02/18 Diabetic Eye exam at [facility] 11/20/18 Diabetic Foot Exam  Review of Systems  Constitutional: Negative.   HENT: Positive for congestion, postnasal drip, sinus pressure, sinus pain and sore throat.   Eyes: Negative.   Respiratory: Negative.   Cardiovascular: Negative.   Gastrointestinal: Positive for abdominal distention and diarrhea.  Endocrine: Negative.   Genitourinary: Positive for urgency.  Musculoskeletal: Positive for back pain.  Skin: Negative.   Allergic/Immunologic: Positive for food allergies.  Neurological: Negative.   Hematological: Negative.   Psychiatric/Behavioral: Negative.     Social History   Socioeconomic History  . Marital status: Married    Spouse name: Not on file  . Number of children:  Not on file  . Years of education: Not on file  . Highest education level: Not on file  Occupational History  . Not on file  Social Needs  . Financial resource strain: Not on file  . Food insecurity    Worry: Not on file    Inability: Not on file  . Transportation needs    Medical: Not on file    Non-medical: Not on file  Tobacco Use  . Smoking status: Never Smoker  . Smokeless tobacco: Never Used  Substance and Sexual Activity  . Alcohol use: No    Alcohol/week: 0.0 standard drinks  . Drug use: No  . Sexual activity: Not on file  Lifestyle  . Physical activity    Days per week: Not on file    Minutes per session: Not on file  . Stress: Not on file  Relationships  . Social Herbalist on phone: Not on file    Gets together: Not on file    Attends religious service: Not on file    Active member of club or organization: Not on file    Attends meetings of clubs or organizations: Not on file    Relationship status: Not on file  . Intimate partner violence    Fear of current or ex partner: Not on file    Emotionally abused: Not on file    Physically abused: Not on file    Forced sexual activity: Not on file  Other Topics Concern  . Not on file  Social History Narrative  . Not on file    Patient Active Problem List   Diagnosis Date Noted  . S/P arthroscopic surgery of right knee 05/11/2018  . Allergic rhinitis 01/27/2015  . Benign essential HTN 01/27/2015  .  Controlled diabetes mellitus type II without complication ([place]) 84/66/5993  . Diverticulosis of colon 01/27/2015  . Hypercholesteremia 01/27/2015  . Adiposity 01/27/2015  . Osteopenia 01/27/2015  . Avitaminosis D 01/27/2015   Past Surgical History:  Procedure Laterality Date  . ABDOMINAL HYSTERECTOMY  1975  . CATARACT EXTRACTION  03/01/2001  . KNEE SURGERY  03/24/2014   Her family history includes Arthritis in her mother; Asthma in her brother and father; Cancer in her father; Diabetes in her mother;  Heart disease in her mother; Hyperlipidemia in her mother; Hypertension in her mother and sister; Mental illness in her mother.     Allergies  Allergen Reactions  . Amoxicillin Diarrhea  . Cefprozil   . Hydrochlorothiazide Cough  . Lovastatin     Other reaction(s): Muscle Cramps  . Medrol  [Methylprednisolone]   . Metformin Hcl Diarrhea    Severe Diarrhea  . Metronidazole     diarrhea  . Nitrofurantoin Monohyd Macro     Other reaction(s): Wheezing  . Pravastatin Sodium     Other reaction(s): Muscle Pain  . Tetracycline Hives  . Clarithromycin Rash   Outpatient Encounter Medications as of 06/06/2019  Medication Sig Note  . atorvastatin (LIPITOR) 10 MG tablet Take 1 tablet (10 mg total) by mouth at bedtime.   Marland Kitchen CALCIUM CARBONATE-VIT D-MIN PO Take by mouth 2 (two) times daily. 01/27/2015: Received from: [place]:   . Cholecalciferol (VITAMIN D) 2000 UNITS tablet Take by mouth daily. 01/27/2015: Received from: [place]:   . fluticasone (FLONASE) 50 MCG/ACT nasal spray Place into the nose daily. 01/27/2015: Received from: [place]:   . glipiZIDE (GLUCOTROL XL) 5 MG 24 hr tablet TAKE 1 TABLET BY MOUTH ONCE DAILY. *TAKE30 MINUTES BEFORE THE BIGGESTMEAL OF THE DAY*   . ibuprofen (ADVIL,MOTRIN) 200 MG tablet Take by mouth as needed. 01/27/2015: Medication taken as needed.  Received from: [place]:   . Loratadine 10 MG CAPS Take by mouth daily. 01/27/2015: Received from: [place]:   . losartan (COZAAR) 50 MG tablet Take 1 tablet (50 mg total) by mouth daily.   . ONE TOUCH ULTRA TEST test strip USE 1 STRIP TO CHECK GLUCOSE ONCE DAILY   . Probiotic Product (FORTIFY DAILY PROBIOTIC PO) Take by mouth.    No facility-administered encounter medications on file as of 06/06/2019.     Patient Care Team: , Vickki Muff, PA as PCP -  General (Family Medicine)      Objective:   Vitals:  Vitals:   06/06/19 1106  BP: (!) 170/74  Pulse: 83  Temp: (!) 97.1 F (36.2 C)  TempSrc: Skin  SpO2: 97%  Weight: 197 lb (89.4 kg)  Height: _0  (1.626 m)   Physical Exam Constitutional:      Appearance: She is well-developed.  HENT:     Head: Normocephalic and atraumatic.     Right Ear: External ear normal.     Left Ear: External ear normal.     Nose: Nose normal.  Eyes:     General:        Right eye: No discharge.     Conjunctiva/sclera: Conjunctivae normal.     Pupils: Pupils are equal, round, and reactive to light.  Neck:     Musculoskeletal: Normal range of motion and neck supple.     Thyroid: No thyromegaly.     Trachea: No tracheal deviation.  Cardiovascular:  Rate and Rhythm: Normal rate and regular rhythm.     Heart sounds: Normal heart sounds. No murmur.  Pulmonary:     Effort: Pulmonary effort is normal. No respiratory distress.     Breath sounds: Normal breath sounds. No wheezing or rales.  Chest:     Chest wall: No tenderness.  Abdominal:     General: There is no distension.     Palpations: Abdomen is soft. There is no mass.     Tenderness: There is abdominal tenderness. There is no guarding or rebound.     Comments: Suprapubic pressure discomfort with palpation. No CVA tenderness to percussion posteriorly. No masses palpable.  Musculoskeletal: Normal range of motion.        General: No tenderness.     Comments: Crepitus in knees to test ROM. No edema or erythema.   Lymphadenopathy:     Cervical: No cervical adenopathy.  Skin:    General: Skin is warm and dry.     Findings: No erythema or rash.  Neurological:     Mental Status: She is alert and oriented to person, place, and time.     Cranial Nerves: No cranial nerve deficit.     Motor: No abnormal muscle tone.     Coordination: Coordination normal.     Deep Tendon Reflexes: Reflexes are normal and symmetric. Reflexes normal.   Psychiatric:        Behavior: Behavior normal.        Thought Content: Thought content normal.        Judgment: Judgment normal.     Depression Screen PHQ 2/9 Scores 06/06/2019 09/06/2018 09/03/2018 08/10/2018  PHQ - 2 Score 0 0 0 0  PHQ- 9 Score - - - -   Fall Risk  06/06/2019 09/06/2018 09/03/2018 08/10/2018 08/05/2017  Falls in the past year? 0 0 0 0 No  Number falls in past yr: 0 - - - -  Injury with Fall? 1 - - - -   Functional Status Survey: Is the patient deaf or have difficulty hearing?: No Does the patient have difficulty seeing, even when wearing glasses/contacts?: No Does the patient have difficulty concentrating, remembering, or making decisions?: No Does the patient have difficulty walking or climbing stairs?: Yes Does the patient have difficulty dressing or bathing?: No Does the patient have difficulty doing errands alone such as visiting a doctor's office or shopping?: No   Office Visit from 06/06/2019 in [facility]  AUDIT-C Score  0      Assessment & Plan:     Routine Health Maintenance and Physical Exam  Exercise Activities and Dietary recommendations Goals   Recommend walking 30 minutes 3 times a week for exercise and low fat diet.     Immunization History  Administered Date(s) Administered  . Influenza Split 06/25/2010, 05/20/2011  . Influenza, High Dose Seasonal PF 06/06/2014, 06/08/2015, 06/03/2016, 06/24/2017  . Influenza,inj,Quad PF,6+ Mos 05/03/2013  . Influenza-Unspecified 06/01/2014, 06/03/2016, 07/01/2018  . Pneumococcal Conjugate-13 06/06/2014  . Pneumococcal Polysaccharide-23 07/03/2007  . Td 08/02/2004  . Tdap 05/20/2011  . Zoster 01/04/2013    Health Maintenance  Topic Date Due  . INFLUENZA VACCINE  04/02/2019  . HEMOGLOBIN A1C  06/01/2019  . OPHTHALMOLOGY EXAM  08/03/2019  . FOOT EXAM  11/30/2019  . TETANUS/TDAP  05/19/2021  . DEXA SCAN  Completed  . PNA vac Low Risk Adult  Completed     Discussed health benefits  of physical activity, and encouraged her to engage in regular exercise  appropriate for her age and condition.   1. Annual physical exam General health stable. Immunizations up to date and has had diverticulosis identified on colonoscopy in 2006 (refuses repeat colonoscopy). Given anticipatory counseling.  2. Urinary tract infection without hematuria, site unspecified Lower abdomen/bladder pressure discomfort the past 3-4 days without fever. Some slight ache in the right mid back but no CVA tenderness to percussion posteriorly. Urinalysis showed RBC's and proteins on dipstick. Will get antibiotic started, should increase fluid intake and start C&S. Check CBC and CMP. Follow up pending reports. - POCT urinalysis dipstick - Urine Culture - ciprofloxacin (CIPRO) 250 MG tablet; Take 1 tablet (250 mg total) by mouth 2 (two) times daily.  Dispense: 14 tablet; Refill: 0 - CBC with Differential/Platelet - Comprehensive metabolic panel  3. Seasonal allergic rhinitis due to pollen Recent flare of PND and scratchy throat after pulling up old carpet in her home. Usually has some flare of rhinorrhea, sneezing and PND in the Spring and Fall. May continue Saline spray, Flonase Spray and Claritin prn. Denies cough or fever. No exposure to any COVID positive individual.  4. Benign essential HTN BP elevated today despite Losartan 50 mg qd. Feels this elevation is due to an UTI. Should restrict salt intake and limit caffeine. Check routine labs and monitor BP at home. If not better in 5-6 days, will need to consider antihypertensive medication. - CBC with Differential/Platelet - Comprehensive metabolic panel - Lipid panel - TSH  5. Hypercholesteremia Tolerating Atorvastatin 10 mg hs without side effects. Recommend she continue low fat diet and refill the Atorvastatin.  - Comprehensive metabolic panel - Lipid panel - TSH  6. Osteopenia, unspecified location Some osteopenia in BMD done 06-16-27. Still taking  Calcium with Vitamin D daily. Recheck routine labs. - CBC with Differential/Platelet - Comprehensive metabolic panel  7. Controlled type 2 diabetes mellitus without complication, without long-term current use of insulin (HCC) States FBS 118 this morning. No hypoglycemic episodes. Will be getting ophthalmology evaluation in December 2020. Had foot exam March 2020. No polydipsia or peripheral neuropathy. Continues to tolerate Glipizide 5 mg qd. Follow low fat diabetic diet and will get follow up labs. - CBC with Differential/Platelet - Comprehensive metabolic panel - Lipid panel - Hemoglobin A1c

## 2019-06-08 LAB — URINE CULTURE

## 2019-06-14 ENCOUNTER — Telehealth: Payer: Self-pay | Admitting: Family Medicine

## 2019-06-14 NOTE — Chronic Care Management (AMB) (Signed)
°  Chronic Care Management   Outreach Note  06/14/2019 Name: Danielle Jackson MRN: [id number] DOB: 20-Dec-1940  Referred by: Margo Common, PA Reason for referral : Chronic Care Management (Initial CCM outreach was unsuccessful. )   An unsuccessful telephone outreach was attempted today. The patient was referred to the case management team by for assistance with care management and care coordination.   Follow Up Plan: A HIPPA compliant phone message was left for the patient providing contact information and requesting a return call.  The care management team will reach out to the patient again over the next 7 days.  If patient returns call to provider office, please advise to call [facility] at [place]  [email or web address]   ??[phone]

## 2019-06-14 NOTE — Chronic Care Management (AMB) (Signed)
Chronic Care Management  ° °Note ° °06/14/2019 °Name: Danielle Jackson MRN: [id number] DOB: 09/29/1940 ° °Danielle Jackson is a 78 y.o. year old female who is a primary care patient of Chrismon, Dennis E, PA. I reached out to Baneen S Bhatt by phone today in response to a referral sent by Danielle Jackson's health plan.    ° °Danielle Jackson was given information about Chronic Care Management services today including:  °1. CCM service includes personalized support from designated clinical staff supervised by her physician, including individualized plan of care and coordination with other care providers °2. 24/7 contact phone numbers for assistance for urgent and routine care needs. °3. Service will only be billed when office clinical staff spend 20 minutes or more in a month to coordinate care. °4. Only one practitioner may furnish and bill the service in a calendar month. °5. The patient may stop CCM services at any time (effective at the end of the month) by phone call to the office staff. °6. The patient will be responsible for cost sharing (co-pay) of up to 20% of the service fee (after annual deductible is met). ° °Patient did not agree to enrollment in care management services and does not wish to consider at this time. ° °Follow up plan: °The patient has been provided with contact information for the chronic care management team and has been advised to call with any health related questions or concerns.  ° °Bernice Cicero °Care Guide • [facility] °[place]   Connected Care  °[email or web address]   ??[phone]   ° ° ° °

## 2019-07-25 ENCOUNTER — Emergency Department
Admission: EM | Admit: 2019-07-25 | Discharge: 2019-07-25 | Disposition: A | Discharge status: Home / Self Care | Payer: PPO | Attending: Emergency Medicine | Admitting: Emergency Medicine

## 2019-07-25 ENCOUNTER — Emergency Department: Payer: PPO

## 2019-07-25 ENCOUNTER — Encounter: Payer: Self-pay | Admitting: Emergency Medicine

## 2019-07-25 ENCOUNTER — Other Ambulatory Visit: Payer: Self-pay

## 2019-07-25 DIAGNOSIS — Y92828 Other wilderness area as the place of occurrence of the external cause: Secondary | ICD-10-CM | POA: Insufficient documentation

## 2019-07-25 DIAGNOSIS — Y9389 Activity, other specified: Secondary | ICD-10-CM | POA: Diagnosis not present

## 2019-07-25 DIAGNOSIS — E119 Type 2 diabetes mellitus without complications: Secondary | ICD-10-CM | POA: Diagnosis not present

## 2019-07-25 DIAGNOSIS — Z7984 Long term (current) use of oral hypoglycemic drugs: Secondary | ICD-10-CM | POA: Insufficient documentation

## 2019-07-25 DIAGNOSIS — Y998 Other external cause status: Secondary | ICD-10-CM | POA: Diagnosis not present

## 2019-07-25 DIAGNOSIS — Z79899 Other long term (current) drug therapy: Secondary | ICD-10-CM | POA: Insufficient documentation

## 2019-07-25 DIAGNOSIS — M545 Low back pain, unspecified: Secondary | ICD-10-CM

## 2019-07-25 DIAGNOSIS — S3992XA Unspecified injury of lower back, initial encounter: Secondary | ICD-10-CM | POA: Diagnosis not present

## 2019-07-25 DIAGNOSIS — W010XXA Fall on same level from slipping, tripping and stumbling without subsequent striking against object, initial encounter: Secondary | ICD-10-CM | POA: Insufficient documentation

## 2019-07-25 DIAGNOSIS — I10 Essential (primary) hypertension: Secondary | ICD-10-CM | POA: Diagnosis not present

## 2019-07-25 MED ORDER — ORPHENADRINE CITRATE 30 MG/ML IJ SOLN
30.0000 mg | Freq: Two times a day (BID) | INTRAMUSCULAR | Status: DC
  Administered 2019-07-25: 30 mg via INTRAMUSCULAR
  Filled 2019-07-25: qty 2

## 2019-07-25 MED ORDER — HYDROMORPHONE HCL 1 MG/ML IJ SOLN
0.5000 mg | Freq: Once | INTRAMUSCULAR | Status: AC
  Administered 2019-07-25: 0.5 mg via INTRAMUSCULAR
  Filled 2019-07-25: qty 1

## 2019-07-25 MED ORDER — TRAMADOL HCL 50 MG PO TABS: 50.0000 mg | ORAL_TABLET | Freq: Two times a day (BID) | ORAL | 0 refills | Status: DC | PRN

## 2019-07-25 MED ORDER — LIDOCAINE 5 % EX PTCH
1.0000 | MEDICATED_PATCH | CUTANEOUS | Status: DC
  Administered 2019-07-25: 11:00:00 1 via TRANSDERMAL
  Filled 2019-07-25: qty 1

## 2019-07-25 MED ORDER — LIDOCAINE 5 % EX PTCH: 1.0000 | MEDICATED_PATCH | Freq: Two times a day (BID) | CUTANEOUS | 0 refills | Status: AC

## 2019-07-25 NOTE — ED Notes (Signed)
See triage note  Presents with lower back pain  States she helped her husband lift a deer carcass   Developed pain to lower back  States pain does note radiate

## 2019-07-25 NOTE — Discharge Instructions (Addendum)
Follow discharge care instruction take medication as directed.  Advised the tramadol may cause drowsiness.

## 2019-07-25 NOTE — ED Triage Notes (Signed)
Pt reports Wednesday fell while deer hunting and was ok but then Thursday when she was helping get rid of the carcass she strained her back. Pt reports took one of her son muscle relaxer's, flexeril and slept.

## 2019-07-25 NOTE — ED Provider Notes (Signed)
[facility] Emergency Department Provider Note   ____________________________________________   First MD Initiated Contact with Patient 07/25/19 [phone]     (approximate)  I have reviewed the triage vital signs and the nursing notes.   HISTORY  Chief Complaint Back Pain    HPI Danielle Jackson is a 78 y.o. female patient complain low back pain secondary to a slip and fall while deer hunting.  Patient states pain increased after she was helped lifting carcass on the back of a truck.  Incident occurred 5 days ago.  Patient did pain has not improved with conservative measures and using a muscle relaxer.  Patient denies radicular component to her back pain.  Patient denies bladder bowel dysfunction.  Patient rates the pain as a 10/10.  Patient described pain is "achy".         Past Medical History:  Diagnosis Date  . Diabetes mellitus without complication ([place])   . Hyperlipidemia   . Hypertension     Patient Active Problem List   Diagnosis Date Noted  . S/P arthroscopic surgery of right knee 05/11/2018  . Allergic rhinitis 01/27/2015  . Benign essential HTN 01/27/2015  . Controlled diabetes mellitus type II without complication ([place]) 25/36/6440  . Diverticulosis of colon 01/27/2015  . Hypercholesteremia 01/27/2015  . Adiposity 01/27/2015  . Osteopenia 01/27/2015  . Avitaminosis D 01/27/2015    Past Surgical History:  Procedure Laterality Date  . ABDOMINAL HYSTERECTOMY  1975  . CATARACT EXTRACTION  03/01/2001  . KNEE SURGERY  03/24/2014    Prior to Admission medications   Medication Sig Start Date End Date Taking? Authorizing Provider  atorvastatin (LIPITOR) 10 MG tablet Take 1 tablet (10 mg total) by mouth at bedtime. 06/06/19   Chrismon, Vickki Muff, PA  CALCIUM CARBONATE-VIT D-MIN PO Take by mouth 2 (two) times daily.    [provider]  Cholecalciferol (VITAMIN D) 2000 UNITS tablet Take by mouth daily.    [provider]   ciprofloxacin (CIPRO) 250 MG tablet Take 1 tablet (250 mg total) by mouth 2 (two) times daily. 06/06/19   Chrismon, Vickki Muff, PA  fluticasone (FLONASE) 50 MCG/ACT nasal spray Place into the nose daily.    [provider]  glipiZIDE (GLUCOTROL XL) 5 MG 24 hr tablet TAKE 1 TABLET BY MOUTH ONCE DAILY. *TAKE30 MINUTES BEFORE THE BIGGESTMEAL OF THE DAY* 12/06/18   Chrismon, Vickki Muff, PA  ibuprofen (ADVIL,MOTRIN) 200 MG tablet Take by mouth as needed.    [provider]  lidocaine (LIDODERM) 5 % Place 1 patch onto the skin every 12 (twelve) hours. Remove & Discard patch within 12 hours or as directed by MD 07/25/19 07/24/20  Sable Feil, PA-C  Loratadine 10 MG CAPS Take by mouth daily. 05/20/11   [provider]  losartan (COZAAR) 50 MG tablet Take 1 tablet (50 mg total) by mouth daily. 11/25/18   Chrismon, Vickki Muff, PA  ONE TOUCH ULTRA TEST test strip USE 1 STRIP TO CHECK GLUCOSE ONCE DAILY 06/28/18   Carmon Ginsberg, PA  Probiotic Product (FORTIFY DAILY PROBIOTIC PO) Take by mouth.    [provider]  traMADol (ULTRAM) 50 MG tablet Take 1 tablet (50 mg total) by mouth every 12 (twelve) hours as needed. 07/25/19   Sable Feil, PA-C    Allergies Amoxicillin, Cefprozil, Hydrochlorothiazide, Lovastatin, Medrol  [methylprednisolone], Metformin hcl, Metronidazole, Nitrofurantoin monohyd macro, Pravastatin sodium, Tetracycline, and Clarithromycin  Family History  Problem Relation Age of Onset  .  Diabetes Mother   . Heart disease Mother   . Hyperlipidemia Mother   . Hypertension Mother   . Arthritis Mother   . Mental illness Mother   . Asthma Father   . Cancer Father   . Hypertension Sister   . Asthma Brother     Social History Social History   Tobacco Use  . Smoking status: Never Smoker  . Smokeless tobacco: Never Used  Substance Use Topics  . Alcohol use: No    Alcohol/week: 0.0 standard drinks  . Drug use: No    Review of Systems   Constitutional: No fever/chills Eyes: No visual changes. ENT: No sore throat. Cardiovascular: Denies chest pain. Respiratory: Denies shortness of breath. Gastrointestinal: No abdominal pain.  No nausea, no vomiting.  No diarrhea.  No constipation. Genitourinary: Negative for dysuria. Musculoskeletal: Negative for back pain. Skin: Negative for rash. Neurological: Negative for headaches, focal weakness or numbness. {**Psychiatric:  Endocrine:  Diabetes, hyperlipidemia, and hypertension. Hematological/Lymphatic:  Allergic/Immunilogical: Amoxicillin, cephalosporins, hydrochlorothiazide, statins, steroids, Metformin, and Flagyl, tetracycline, and clindamycin. ____________________________________________   PHYSICAL EXAM:  VITAL SIGNS: ED Triage Vitals  Enc Vitals Group     BP 07/25/19 0904 (!) 170/76     Pulse Rate 07/25/19 0904 68     Resp 07/25/19 0904 20     Temp 07/25/19 0904 98.2 F (36.8 C)     Temp Source 07/25/19 0904 Oral     SpO2 07/25/19 0904 99 %     Weight 07/25/19 0905 195 lb (88.5 kg)     Height 07/25/19 0905 5\' 4"  (1.626 m)     Head Circumference --      Peak Flow --      Pain Score 07/25/19 0905 10     Pain Loc --      Pain Edu? --      Excl. in GC? --     Constitutional: Alert and oriented. Well appearing and in no acute distress. Neck: No stridor.   Hematological/Lymphatic/Immunilogical: No cervical lymphadenopathy. Cardiovascular: Normal rate, regular rhythm. Grossly normal heart sounds.  Good peripheral circulation.  Evaded blood pressure. Respiratory: Normal respiratory effort.  No retractions. Lungs CTAB. Gastrointestinal: Soft and nontender. No distention. No abdominal bruits. No CVA tenderness. Genitourinary: Deferred Musculoskeletal: No obvious lumbar spine deformity.  Patient is moderate guarding palpation L5-S1.  Patient decreased range of motion with flexion and left lateral movements.  Patient negative straight leg test in the supine position.   No lower extremity tenderness nor edema.  No joint effusions. Neurologic:  Normal speech and language. No gross focal neurologic deficits are appreciated. No gait instability. Skin:  Skin is warm, dry and intact. No rash noted.  No abrasion or ecchymosis. Psychiatric: Mood and affect are normal. Speech and behavior are normal.  ____________________________________________   LABS (all labs ordered are listed, but only abnormal results are displayed)  Labs Reviewed - No data to display ____________________________________________  EKG   ____________________________________________  RADIOLOGY  ED MD interpretation:    Official radiology report(s): Dg Lumbar Spine 2-3 Views  Result Date: 07/25/2019 CLINICAL DATA:  Pain secondary to fall, no radiculopathy, initial encounter. EXAM: LUMBAR SPINE - 2-3 VIEW COMPARISON:  None. FINDINGS: Minimal grade 1 anterolisthesis of L4 on L5. Alignment is otherwise anatomic. Vertebral body height is maintained. Mild multilevel endplate degenerative changes. Facet hypertrophy in the mid and lower lumbar spine. IMPRESSION: 1. Minimal anterolisthesis of L4 on L5, likely degenerative in etiology. No acute findings. 2. Mild multilevel endplate degenerative changes with  facet hypertrophy in the mid and lower lumbar spine. Electronically Signed   By: Leanna BattlesMelinda  Blietz M.D.   On: 07/25/2019 10:42    ____________________________________________   PROCEDURES  Procedure(s) performed (including Critical Care):  Procedures   ____________________________________________   INITIAL IMPRESSION / ASSESSMENT AND PLAN / ED COURSE  As part of my medical decision making, I reviewed the following data within the electronic MEDICAL RECORD NUMBER     Patient presents with low back pain secondary to a fall in a lifting incident 5 days ago.  There is no radicular component to her back pain.  X-rays of the lumbar spine were grossly unremarkable.  Patient findings consistent  with acute back pain secondary to strain.  Patient given discharge care instructions and advised to take medication as directed.  Follow-up with PCP.    Danielle Jackson was evaluated in Emergency Department on 07/25/2019 for the symptoms described in the history of present illness. She was evaluated in the context of the global COVID-19 pandemic, which necessitated consideration that the patient might be at risk for infection with the SARS-CoV-2 virus that causes COVID-19. Institutional protocols and algorithms that pertain to the evaluation of patients at risk for COVID-19 are in a state of rapid change based on information released by regulatory bodies including the CDC and federal and state organizations. These policies and algorithms were followed during the patient's care in the ED.       ____________________________________________   FINAL CLINICAL IMPRESSION(S) / ED DIAGNOSES  Final diagnoses:  Acute right-sided low back pain without sciatica     ED Discharge Orders         Ordered    lidocaine (LIDODERM) 5 %  Every 12 hours     07/25/19 1058    traMADol (ULTRAM) 50 MG tablet  Every 12 hours PRN     07/25/19 1058           Note:  This document was prepared using Dragon voice recognition software and may include unintentional dictation errors.    Joni ReiningSmith,  K, PA-C 07/25/19 1108    Chesley NoonJessup, Charles, MD 07/25/19 [phone]

## 2019-08-02 ENCOUNTER — Encounter: Payer: Self-pay | Admitting: Family Medicine

## 2019-08-02 ENCOUNTER — Ambulatory Visit (INDEPENDENT_AMBULATORY_CARE_PROVIDER_SITE_OTHER): Payer: PPO | Admitting: Family Medicine

## 2019-08-02 VITALS — BP 136/70 | HR 79 | Temp 96.9°F | Wt 196.0 lb

## 2019-08-02 DIAGNOSIS — M8580 Other specified disorders of bone density and structure, unspecified site: Secondary | ICD-10-CM | POA: Diagnosis not present

## 2019-08-02 DIAGNOSIS — M545 Low back pain, unspecified: Secondary | ICD-10-CM

## 2019-08-02 DIAGNOSIS — E7800 Pure hypercholesterolemia, unspecified: Secondary | ICD-10-CM | POA: Diagnosis not present

## 2019-08-02 DIAGNOSIS — N390 Urinary tract infection, site not specified: Secondary | ICD-10-CM | POA: Diagnosis not present

## 2019-08-02 DIAGNOSIS — I10 Essential (primary) hypertension: Secondary | ICD-10-CM | POA: Diagnosis not present

## 2019-08-02 DIAGNOSIS — E119 Type 2 diabetes mellitus without complications: Secondary | ICD-10-CM | POA: Diagnosis not present

## 2019-08-02 NOTE — Progress Notes (Signed)
Danielle Jackson  MRN: [id number] DOB: 11/12/1940  Subjective:  HPI   The patient is a 78 year old female who presents today for follow up of ER visit after a fall.  She stated to the ER department that she was having low back pain after slipping and falling while deer hunting.  After lifting the carcass on the back of a truck she noticed having increased pain.  X-rays at the time of that visit were relatively normal.  She was discharged from the ER with instructions and Lidocaine and Tramadol prescriptions. Patient clarified that the injury actually came from lifting not the fall. Patient has been using the icy hot spray and states that this has been helping.  Patient Active Problem List   Diagnosis Date Noted  . S/P arthroscopic surgery of right knee 05/11/2018  . Allergic rhinitis 01/27/2015  . Benign essential HTN 01/27/2015  . Controlled diabetes mellitus type II without complication (HCC) 01/27/2015  . Diverticulosis of colon 01/27/2015  . Hypercholesteremia 01/27/2015  . Adiposity 01/27/2015  . Osteopenia 01/27/2015  . Avitaminosis D 01/27/2015   Past Medical History:  Diagnosis Date  . Diabetes mellitus without complication (HCC)   . Hyperlipidemia   . Hypertension    Past Surgical History:  Procedure Laterality Date  . ABDOMINAL HYSTERECTOMY  1975  . CATARACT EXTRACTION  03/01/2001  . KNEE SURGERY  03/24/2014   Family History  Problem Relation Age of Onset  . Diabetes Mother   . Heart disease Mother   . Hyperlipidemia Mother   . Hypertension Mother   . Arthritis Mother   . Mental illness Mother   . Asthma Father   . Cancer Father   . Hypertension Sister   . Asthma Brother    Social History   Socioeconomic History  . Marital status: Married    Spouse name: Not on file  . Number of children: Not on file  . Years of education: Not on file  . Highest education level: Not on file  Occupational History  . Not on file  Social Needs  . Financial resource  strain: Not on file  . Food insecurity    Worry: Not on file    Inability: Not on file  . Transportation needs    Medical: Not on file    Non-medical: Not on file  Tobacco Use  . Smoking status: Never Smoker  . Smokeless tobacco: Never Used  Substance and Sexual Activity  . Alcohol use: No    Alcohol/week: 0.0 standard drinks  . Drug use: No  . Sexual activity: Not on file  Lifestyle  . Physical activity    Days per week: Not on file    Minutes per session: Not on file  . Stress: Not on file  Relationships  . Social Musicianconnections    Talks on phone: Not on file    Gets together: Not on file    Attends religious service: Not on file    Active member of club or organization: Not on file    Attends meetings of clubs or organizations: Not on file    Relationship status: Not on file  . Intimate partner violence    Fear of current or ex partner: Not on file    Emotionally abused: Not on file    Physically abused: Not on file    Forced sexual activity: Not on file  Other Topics Concern  . Not on file  Social History Narrative  . Not on file  Outpatient Encounter Medications as of 08/02/2019  Medication Sig Note  . atorvastatin (LIPITOR) 10 MG tablet Take 1 tablet (10 mg total) by mouth at bedtime.   Marland Kitchen CALCIUM CARBONATE-VIT D-MIN PO Take by mouth 2 (two) times daily. 01/27/2015: Received from: [place]:   . Cholecalciferol (VITAMIN D) 2000 UNITS tablet Take by mouth daily. 01/27/2015: Received from: [facility]:   . ciprofloxacin (CIPRO) 250 MG tablet Take 1 tablet (250 mg total) by mouth 2 (two) times daily.   . fluticasone (FLONASE) 50 MCG/ACT nasal spray Place into the nose daily. 01/27/2015: Received from: [place]:   . glipiZIDE (GLUCOTROL XL) 5 MG 24 hr tablet TAKE 1 TABLET BY MOUTH ONCE DAILY. *TAKE30 MINUTES BEFORE THE BIGGESTMEAL OF THE DAY*   . ibuprofen (ADVIL,MOTRIN) 200 MG  tablet Take by mouth as needed. 01/27/2015: Medication taken as needed.  Received from: [place]:   . lidocaine (LIDODERM) 5 % Place 1 patch onto the skin every 12 (twelve) hours. Remove & Discard patch within 12 hours or as directed by MD   . Loratadine 10 MG CAPS Take by mouth daily. 01/27/2015: Received from: [facility]:   . losartan (COZAAR) 50 MG tablet Take 1 tablet (50 mg total) by mouth daily.   . ONE TOUCH ULTRA TEST test strip USE 1 STRIP TO CHECK GLUCOSE ONCE DAILY   . Probiotic Product (FORTIFY DAILY PROBIOTIC PO) Take by mouth.   . traMADol (ULTRAM) 50 MG tablet Take 1 tablet (50 mg total) by mouth every 12 (twelve) hours as needed.    No facility-administered encounter medications on file as of 08/02/2019.     Allergies  Allergen Reactions  . Amoxicillin Diarrhea  . Cefprozil   . Hydrochlorothiazide Cough  . Lovastatin     Other reaction(s): Muscle Cramps  . Medrol  [Methylprednisolone]   . Metformin Hcl Diarrhea    Severe Diarrhea  . Metronidazole     diarrhea  . Nitrofurantoin Monohyd Macro     Other reaction(s): Wheezing  . Pravastatin Sodium     Other reaction(s): Muscle Pain  . Tetracycline Hives  . Clarithromycin Rash    Review of Systems  Constitutional: Negative for chills, diaphoresis, fever and malaise/fatigue.  HENT: Negative for congestion, ear pain, sinus pain and sore throat.   Respiratory: Negative for cough and shortness of breath.   Cardiovascular: Negative for chest pain.  Gastrointestinal: Negative for abdominal pain and diarrhea.  Musculoskeletal: Positive for back pain (radiates through her buttocks and down the leg.) and myalgias.  Neurological: Negative for headaches.    Objective:  BP 136/70 (BP Location: Right Arm, Patient Position: Sitting, Cuff Size: Normal)   Pulse 79   Temp (!) 96.9 F (36.1 C) (Skin)   Wt 196 lb (88.9 kg)   SpO2 98%   BMI 33.64 kg/m   Physical  Exam  Constitutional: She is oriented to person, place, and time and well-developed, well-nourished, and in no distress.  HENT:  Head: Normocephalic.  Eyes: Conjunctivae are normal.  Neck: Neck supple.  Pulmonary/Chest: Effort normal.  Abdominal: Soft.  Musculoskeletal: Normal range of motion.        General: Tenderness present.     Comments: Right lower lumbar musculature tenderness to palpation. No radiation of pain or numbness. Fair ROM today. DTR's symmetric.  Neurological: She is alert and oriented to person, place, and time.  Skin: No rash noted.  Psychiatric:  Mood, affect and judgment normal.    Assessment and Plan :  1. Acute right-sided low back pain without sciatica Strained the right lower back helping her husband lift a deer carcass on 07-25-19. Went to the ER and x-rays did not show any acute bony injury. No radiation of pain, weakness or numbness. Had tried Tramadol without much relief. Had better improvement and pain control using Ibuprofen 200 mg 3 tablets q 6-8 hours. Moist heat and Icy-Hot with Lidocaine had helped a great deal also. Continue present regimen and recheck prn.

## 2019-08-03 DIAGNOSIS — H35033 Hypertensive retinopathy, bilateral: Secondary | ICD-10-CM | POA: Diagnosis not present

## 2019-08-03 LAB — LIPID PANEL
Chol/HDL Ratio: 4.3 ratio (ref 0.0–4.4)
Cholesterol, Total: 180 mg/dL (ref 100–199)
HDL: 42 mg/dL (ref >39)
LDL Chol Calc (NIH): 90 mg/dL (ref 0–99)
Triglycerides: 288 mg/dL — ABNORMAL HIGH (ref 0–149)
VLDL Cholesterol Cal: 48 mg/dL — ABNORMAL HIGH (ref 5–40)

## 2019-08-03 LAB — CBC WITH DIFFERENTIAL/PLATELET
Basophils Absolute: 0 10*3/uL (ref 0.0–0.2)
Basos: 0 %
EOS (ABSOLUTE): 0.3 10*3/uL (ref 0.0–0.4)
Eos: 4 %
Hematocrit: 42.2 % (ref 34.0–46.6)
Hemoglobin: 14.4 g/dL (ref 11.1–15.9)
Immature Grans (Abs): 0 10*3/uL (ref 0.0–0.1)
Immature Granulocytes: 1 %
Lymphocytes Absolute: 1.9 10*3/uL (ref 0.7–3.1)
Lymphs: 29 %
MCH: 33.1 pg — ABNORMAL HIGH (ref 26.6–33.0)
MCHC: 34.1 g/dL (ref 31.5–35.7)
MCV: 97 fL (ref 79–97)
Monocytes Absolute: 0.5 10*3/uL (ref 0.1–0.9)
Monocytes: 7 %
Neutrophils Absolute: 3.9 10*3/uL (ref 1.4–7.0)
Neutrophils: 59 %
Platelets: 190 10*3/uL (ref 150–450)
RBC: 4.35 x10E6/uL (ref 3.77–5.28)
RDW: 13.3 % (ref 11.7–15.4)
WBC: 6.6 10*3/uL (ref 3.4–10.8)

## 2019-08-03 LAB — COMPREHENSIVE METABOLIC PANEL
ALT: 15 IU/L (ref 0–32)
AST: 16 IU/L (ref 0–40)
Albumin/Globulin Ratio: 1.6 (ref 1.2–2.2)
Albumin: 4.1 g/dL (ref 3.7–4.7)
Alkaline Phosphatase: 84 IU/L (ref 39–117)
BUN/Creatinine Ratio: 16 (ref 12–28)
BUN: 16 mg/dL (ref 8–27)
Bilirubin Total: 0.5 mg/dL (ref 0.0–1.2)
CO2: 23 mmol/L (ref 20–29)
Calcium: 9.7 mg/dL (ref 8.7–10.3)
Chloride: 102 mmol/L (ref 96–106)
Creatinine, Ser: 0.98 mg/dL (ref 0.57–1.00)
GFR calc Af Amer: 64 mL/min/{1.73_m2} (ref >59)
GFR calc non Af Amer: 56 mL/min/{1.73_m2} — ABNORMAL LOW (ref >59)
Globulin, Total: 2.6 g/dL (ref 1.5–4.5)
Glucose: 129 mg/dL — ABNORMAL HIGH (ref 65–99)
Potassium: 4.4 mmol/L (ref 3.5–5.2)
Sodium: 139 mmol/L (ref 134–144)
Total Protein: 6.7 g/dL (ref 6.0–8.5)

## 2019-08-03 LAB — HM DIABETES EYE EXAM

## 2019-08-03 LAB — TSH: TSH: 2.05 u[IU]/mL (ref 0.450–4.500)

## 2019-08-03 LAB — HEMOGLOBIN A1C
Est. average glucose Bld gHb Est-mCnc: 140 mg/dL
Hgb A1c MFr Bld: 6.5 % — ABNORMAL HIGH (ref 4.8–5.6)

## 2019-08-10 ENCOUNTER — Other Ambulatory Visit: Payer: Self-pay | Admitting: Family Medicine

## 2019-08-10 DIAGNOSIS — E119 Type 2 diabetes mellitus without complications: Secondary | ICD-10-CM

## 2019-08-23 ENCOUNTER — Ambulatory Visit (INDEPENDENT_AMBULATORY_CARE_PROVIDER_SITE_OTHER): Payer: PPO | Admitting: Physician Assistant

## 2019-08-23 DIAGNOSIS — J0110 Acute frontal sinusitis, unspecified: Secondary | ICD-10-CM

## 2019-08-23 MED ORDER — AZITHROMYCIN 250 MG PO TABS: ORAL_TABLET | ORAL | 0 refills | Status: DC

## 2019-08-23 NOTE — Progress Notes (Signed)
Patient: Danielle Jackson Female    DOB: 1941-01-27   78 y.o.   MRN: [id number] Visit Date: 08/23/2019  Today's Provider: Trinna Post, PA-C   Chief Complaint  Patient presents with  . Headache   Subjective:    Virtual Visit via Telephone Note  I connected with Danielle Jackson on 08/23/19 at  3:40 PM EST by telephone and verified that I am speaking with the correct person using two identifiers.  Location: Patient: Home Provider: Office    I discussed the limitations, risks, security and privacy concerns of performing an evaluation and management service by telephone and the availability of in person appointments. I also discussed with the patient that there may be a patient responsible charge related to this service. The patient expressed understanding and agreed to proceed.     Headache  This is a new problem. The current episode started 1 to 4 weeks ago. The quality of the pain is described as dull. The pain is at a severity of 0/10. The patient is experiencing no pain. Associated symptoms include coughing, drainage, rhinorrhea, sinus pressure and a sore throat. Pertinent negatives include no abdominal pain, blurred vision, dizziness, fever, nausea, vomiting or weakness. Nothing aggravates the symptoms. She has tried acetaminophen and NSAIDs for the symptoms. The treatment provided no relief.   Patient reports ongoing headache x 2 weeks. She had been using excedrin for headache which did help. She used Sudafed and did experience some nasal drainage with this. She has used saline nasal rinses. She is having unilateral right sided pain above her eye and dental pain. She has used some mucinex. No fever, no SOB. She reports an occasional cough from PND. Denies COVID positive contacts. Denies wheezing.   Allergies  Allergen Reactions  . Amoxicillin Diarrhea  . Cefprozil   . Hydrochlorothiazide Cough  . Lovastatin     Other reaction(s): Muscle Cramps  . Medrol   [Methylprednisolone]   . Metformin Hcl Diarrhea    Severe Diarrhea  . Metronidazole     diarrhea  . Nitrofurantoin Monohyd Macro     Other reaction(s): Wheezing  . Pravastatin Sodium     Other reaction(s): Muscle Pain  . Tetracycline Hives  . Clarithromycin Rash     Current Outpatient Medications:  .  atorvastatin (LIPITOR) 10 MG tablet, Take 1 tablet (10 mg total) by mouth at bedtime., Disp: 90 tablet, Rfl: 3 .  CALCIUM CARBONATE-VIT D-MIN PO, Take by mouth 2 (two) times daily., Disp: , Rfl:  .  Cholecalciferol (VITAMIN D) 2000 UNITS tablet, Take by mouth daily., Disp: , Rfl:  .  ciprofloxacin (CIPRO) 250 MG tablet, Take 1 tablet (250 mg total) by mouth 2 (two) times daily., Disp: 14 tablet, Rfl: 0 .  fluticasone (FLONASE) 50 MCG/ACT nasal spray, Place into the nose daily., Disp: , Rfl:  .  glipiZIDE (GLUCOTROL XL) 5 MG 24 hr tablet, TAKE 1 TABLET BY MOUTH ONCE DAILY. *TAKE30 MINUTES BEFORE THE BIGGESTMEAL OF THE DAY*, Disp: 90 tablet, Rfl: 3 .  ibuprofen (ADVIL,MOTRIN) 200 MG tablet, Take by mouth as needed., Disp: , Rfl:  .  lidocaine (LIDODERM) 5 %, Place 1 patch onto the skin every 12 (twelve) hours. Remove & Discard patch within 12 hours or as directed by MD, Disp: 10 patch, Rfl: 0 .  Loratadine 10 MG CAPS, Take by mouth daily., Disp: , Rfl:  .  losartan (COZAAR) 50 MG tablet, Take 1 tablet (50 mg total) by mouth  daily., Disp: 90 tablet, Rfl: 3 .  ONETOUCH ULTRA test strip, USE ONE STRIP TO CHECK GLUCOSE ONCE DAILY, Disp: 100 each, Rfl: 3 .  Probiotic Product (FORTIFY DAILY PROBIOTIC PO), Take by mouth., Disp: , Rfl:  .  traMADol (ULTRAM) 50 MG tablet, Take 1 tablet (50 mg total) by mouth every 12 (twelve) hours as needed., Disp: 12 tablet, Rfl: 0  Review of Systems  Constitutional: Negative for appetite change, chills, fatigue and fever.  HENT: Positive for congestion, postnasal drip, rhinorrhea, sinus pressure and sore throat. Negative for sinus pain and sneezing.   Eyes:  Negative for blurred vision.  Respiratory: Positive for cough. Negative for chest tightness, shortness of breath and wheezing.   Cardiovascular: Negative for chest pain and palpitations.  Gastrointestinal: Negative for abdominal pain, nausea and vomiting.  Neurological: Positive for headaches. Negative for dizziness and weakness.    Social History   Tobacco Use  . Smoking status: Never Smoker  . Smokeless tobacco: Never Used  Substance Use Topics  . Alcohol use: No    Alcohol/week: 0.0 standard drinks      Objective:   There were no vitals taken for this visit. There were no vitals filed for this visit.There is no height or weight on file to calculate BMI.   Physical Exam   No results found for any visits on 08/23/19.     Assessment & Plan    1. Acute non-recurrent frontal sinusitis  Patient has multiple intolerances to antibiotics, including diarrhea to amoxicillin. She says she tolerates azithromycin. Explained high resistance to this antibiotic but I will give it at her request. Return precautions counseled.  - azithromycin (ZITHROMAX) 250 MG tablet; Take 2 pills on day 1 and 1 pill each of the following 4 days.  Dispense: 6 tablet; Refill: 0  I discussed the assessment and treatment plan with the patient. The patient was provided an opportunity to ask questions and all were answered. The patient agreed with the plan and demonstrated an understanding of the instructions.   The patient was advised to call back or seek an in-person evaluation if the symptoms worsen or if the condition fails to improve as anticipated.  The entirety of the information documented in the History of Present Illness, Review of Systems and Physical Exam were personally obtained by me. Portions of this information were initially documented by April M. Hyacinth Meeker, CMA and reviewed by me for thoroughness and accuracy.      Trey Sailors, PA-C  [facility]

## 2019-08-24 NOTE — Patient Instructions (Signed)

## 2019-10-12 ENCOUNTER — Encounter: Payer: Self-pay | Admitting: Physician Assistant

## 2019-10-12 ENCOUNTER — Ambulatory Visit: Payer: Self-pay | Admitting: *Deleted

## 2019-10-12 ENCOUNTER — Ambulatory Visit (INDEPENDENT_AMBULATORY_CARE_PROVIDER_SITE_OTHER): Payer: PPO | Admitting: Physician Assistant

## 2019-10-12 VITALS — BP 140/88 | Temp 95.8°F

## 2019-10-12 DIAGNOSIS — R300 Dysuria: Secondary | ICD-10-CM | POA: Diagnosis not present

## 2019-10-12 DIAGNOSIS — R3989 Other symptoms and signs involving the genitourinary system: Secondary | ICD-10-CM | POA: Diagnosis not present

## 2019-10-12 DIAGNOSIS — E119 Type 2 diabetes mellitus without complications: Secondary | ICD-10-CM | POA: Diagnosis not present

## 2019-10-12 LAB — POCT URINALYSIS DIPSTICK
Bilirubin, UA: NEGATIVE
Glucose, UA: NEGATIVE
Ketones, UA: NEGATIVE
Nitrite, UA: POSITIVE
Protein, UA: POSITIVE — AB
Spec Grav, UA: 1.02 (ref 1.010–1.025)
Urobilinogen, UA: 0.2 E.U./dL
pH, UA: 6 (ref 5.0–8.0)

## 2019-10-12 MED ORDER — CIPROFLOXACIN HCL 250 MG PO TABS: 250.0000 mg | ORAL_TABLET | Freq: Two times a day (BID) | ORAL | 0 refills | Status: DC

## 2019-10-12 NOTE — Telephone Encounter (Signed)
Per initial encounter, "Pt called and states that she has a uti and would like a call back from a nurse. Pt states that she would like a clinical message sent to provider so that she can get an antibiotic.";   Contacted pt regarding her symptoms; the  Pt states she had back pain x 1 week,and; urinary frequency/urgency that started on 10/11/19; her temp 97.2 this am; thept says she did take advil for pain, and a cranberry tablet; recommendations made per nurse triage protocol; she verbalized understanding, and would like a virtual appt; She normally sees [facility] but he has no availability within specified guidelines; pt offered and accepted virtual appt with Osvaldo Angst, [facility], 10/12/19 at 0940; will route to office for notification. Reason for Disposition . Side (flank) or lower back pain present  Answer Assessment - Initial Assessment Questions 1. SYMPTOM: "What's the main symptom you're concerned about?" (e.g., frequency, incontinence)     Urinary frequency/urgency  2. ONSET: "When did the    start?"     10/11/19 3. PAIN: "Is there any pain?" If so, ask: "How bad is it?" (Scale: 1-10; mild, moderate, severe)  8 out of 10 4. CAUSE: "What do you think is causing the symptoms?"     UTI 5. OTHER SYMPTOMS: "Do you have any other symptoms?" (e.g., fever, flank pain, blood in urine, pain with urination)     Pain across bottom of abdomen rated 8 out of 10; urine smells bad; burnig with urination 6. PREGNANCY: "Is there any chance you are pregnant?" "When was your last menstrual period?"   no  Protocols used: URINARY [facility]

## 2019-10-12 NOTE — Progress Notes (Signed)
Patient: Danielle Jackson Female    DOB: 1941/05/27   79 y.o.   MRN: [id number] Visit Date: 10/12/2019  Today's Provider: Trinna Post, PA-C   Chief Complaint  Patient presents with  . Urinary Frequency   Subjective:     Urinary Frequency  This is a recurrent problem. The current episode started yesterday. The problem has been unchanged. There has been no fever. Associated symptoms include frequency. Pertinent negatives include no discharge, flank pain, hesitancy, nausea, urgency or vomiting.     Allergies  Allergen Reactions  . Amoxicillin Diarrhea  . Cefprozil   . Hydrochlorothiazide Cough  . Lovastatin     Other reaction(s): Muscle Cramps  . Medrol  [Methylprednisolone]   . Metformin Hcl Diarrhea    Severe Diarrhea  . Metronidazole     diarrhea  . Nitrofurantoin Monohyd Macro     Other reaction(s): Wheezing  . Pravastatin Sodium     Other reaction(s): Muscle Pain  . Tetracycline Hives  . Clarithromycin Rash     Current Outpatient Medications:  .  atorvastatin (LIPITOR) 10 MG tablet, Take 1 tablet (10 mg total) by mouth at bedtime., Disp: 90 tablet, Rfl: 3 .  azithromycin (ZITHROMAX) 250 MG tablet, Take 2 pills on day 1 and 1 pill each of the following 4 days., Disp: 6 tablet, Rfl: 0 .  CALCIUM CARBONATE-VIT D-MIN PO, Take by mouth 2 (two) times daily., Disp: , Rfl:  .  Cholecalciferol (VITAMIN D) 2000 UNITS tablet, Take by mouth daily., Disp: , Rfl:  .  ciprofloxacin (CIPRO) 250 MG tablet, Take 1 tablet (250 mg total) by mouth 2 (two) times daily., Disp: 14 tablet, Rfl: 0 .  fluticasone (FLONASE) 50 MCG/ACT nasal spray, Place into the nose daily., Disp: , Rfl:  .  glipiZIDE (GLUCOTROL XL) 5 MG 24 hr tablet, TAKE 1 TABLET BY MOUTH ONCE DAILY. *TAKE30 MINUTES BEFORE THE BIGGESTMEAL OF THE DAY*, Disp: 90 tablet, Rfl: 3 .  ibuprofen (ADVIL,MOTRIN) 200 MG tablet, Take by mouth as needed., Disp: , Rfl:  .  lidocaine (LIDODERM) 5 %, Place 1 patch onto the skin  every 12 (twelve) hours. Remove & Discard patch within 12 hours or as directed by MD, Disp: 10 patch, Rfl: 0 .  Loratadine 10 MG CAPS, Take by mouth daily., Disp: , Rfl:  .  losartan (COZAAR) 50 MG tablet, Take 1 tablet (50 mg total) by mouth daily., Disp: 90 tablet, Rfl: 3 .  ONETOUCH ULTRA test strip, USE ONE STRIP TO CHECK GLUCOSE ONCE DAILY, Disp: 100 each, Rfl: 3 .  Probiotic Product (FORTIFY DAILY PROBIOTIC PO), Take by mouth., Disp: , Rfl:  .  traMADol (ULTRAM) 50 MG tablet, Take 1 tablet (50 mg total) by mouth every 12 (twelve) hours as needed., Disp: 12 tablet, Rfl: 0  Review of Systems  Gastrointestinal: Negative for nausea and vomiting.  Genitourinary: Positive for frequency. Negative for flank pain, hesitancy and urgency.    Social History   Tobacco Use  . Smoking status: Never Smoker  . Smokeless tobacco: Never Used  Substance Use Topics  . Alcohol use: No    Alcohol/week: 0.0 standard drinks      Objective:   BP 140/88 (BP Location: Left Arm, Patient Position: Sitting, Cuff Size: Normal)   Temp (!) 95.8 F (35.4 C) (Temporal)  Vitals:   10/12/19 1006  BP: 140/88  Temp: (!) 95.8 F (35.4 C)  TempSrc: Temporal  There is no height or weight on  file to calculate BMI.   Physical Exam Constitutional:      General: She is not in acute distress.    Appearance: She is well-developed. She is not diaphoretic.  Cardiovascular:     Rate and Rhythm: Normal rate and regular rhythm.  Pulmonary:     Effort: Pulmonary effort is normal.     Breath sounds: Normal breath sounds.  Abdominal:     General: Bowel sounds are normal. There is no distension.     Palpations: Abdomen is soft.     Tenderness: There is abdominal tenderness in the suprapubic area. There is no guarding or rebound.  Skin:    General: Skin is warm and dry.  Neurological:     Mental Status: She is alert and oriented to person, place, and time.  Psychiatric:        Behavior: Behavior normal.       No results found for any visits on 10/12/19.     Assessment & Plan  1. Dysuria  - ciprofloxacin (CIPRO) 250 MG tablet; Take 1 tablet (250 mg total) by mouth 2 (two) times daily.  Dispense: 6 tablet; Refill: 0  2. Possible urinary tract infection  - POCT urinalysis dipstick - CULTURE, URINE COMPREHENSIVE  3. Controlled type 2 diabetes mellitus without complication, without long-term current use of insulin (HCC)  The entirety of the information documented in the History of Present Illness, Review of Systems and Physical Exam were personally obtained by me. Portions of this information were initially documented by [facility] and reviewed by me for thoroughness and accuracy.   F/u PRN      Trey Sailors, PA-C  [facility]

## 2019-10-12 NOTE — Patient Instructions (Signed)

## 2019-10-15 LAB — CULTURE, URINE COMPREHENSIVE

## 2019-10-17 ENCOUNTER — Telehealth: Payer: Self-pay

## 2019-10-17 NOTE — Telephone Encounter (Signed)
Patient advised as directed below. 

## 2019-10-17 NOTE — Telephone Encounter (Signed)
-----   Message from Trey Sailors, [place] sent at 10/17/2019  1:25 PM EST ----- Culture grew out bacteria which is sensitive to cipro prescribed.

## 2020-01-09 NOTE — Progress Notes (Signed)
Established patient visit  I,Danielle Jackson,acting as a scribe for Danielle Jackson.,have documented all relevant documentation on the behalf of Danielle Jackson,as directed by  Danielle Jackson while in the presence of Danielle Jackson.   Patient: Danielle Jackson   DOB: April 01, 1941   79 y.o. Female  MRN: [id number] Visit Date: 01/12/2020  Today's healthcare provider: [name], Jackson   Chief Complaint  Patient presents with  . Diabetes  . Hypertension   Subjective    HPI  Diabetes Mellitus Type II, Follow-up  Lab Results  Component Value Date   HGBA1C 6.5 (H) 08/02/2019   HGBA1C 6.8 (A) 11/30/2018   HGBA1C 6.6 (A) 08/10/2018   Wt Readings from Last 3 Encounters:  01/12/20 199 lb (90.3 kg)  08/02/19 196 lb (88.9 kg)  07/25/19 195 lb (88.5 kg)   Last seen for diabetes 3 months ago.  Management since then includes diet and Glucotrol-XL 5 mg qd. She reports good compliance with treatment. She is not having side effects.  Symptoms: No fatigue No foot ulcerations No appetite changes No nausea No paresthesia (numbness or tingling) of the feet  No polydipsia (excessive thirst) No polyuria (frequent urination) No visual disturbances  No vomiting  Home blood sugar records: 113-150  Episodes of hypoglycemia? No    Most Recent Eye Exam: 08/2019 Patient is due for a diabetic foot exam                                Pertinent Labs: Lab Results  Component Value Date   CHOL 180 08/02/2019   HDL 42 08/02/2019   LDLCALC 90 08/02/2019   TRIG 288 (H) 08/02/2019   CHOLHDL 4.3 08/02/2019   Lab Results  Component Value Date   NA 139 08/02/2019   K 4.4 08/02/2019   CREATININE 0.98 08/02/2019   GFRNONAA 56 (L) 08/02/2019   GFRAA 64 08/02/2019   GLUCOSE 129 (H) 08/02/2019     --------------------------------------------------------------------------------------------------- Hypertension, follow-up  BP Readings from Last 3 Encounters:  01/12/20  132/80  10/12/19 140/88  08/02/19 136/70   Wt Readings from Last 3 Encounters:  01/12/20 199 lb (90.3 kg)  08/02/19 196 lb (88.9 kg)  07/25/19 195 lb (88.5 kg)       She reports good compliance with treatment. She is not having side effects Outside blood pressures are 110's over 70's   Pertinent labs: Lab Results  Component Value Date   CHOL 180 08/02/2019   HDL 42 08/02/2019   LDLCALC 90 08/02/2019   TRIG 288 (H) 08/02/2019   CHOLHDL 4.3 08/02/2019   Lab Results  Component Value Date   NA 139 08/02/2019   K 4.4 08/02/2019   CREATININE 0.98 08/02/2019   GFRNONAA 56 (L) 08/02/2019   GFRAA 64 08/02/2019   GLUCOSE 129 (H) 08/02/2019     The 10-year ASCVD risk score Denman George DC Jr., et al., 2013) is: 44.8%   ---------------------------------------------------------------------------------------------------   Depression screen [facility] 2/9 01/12/2020 06/06/2019 09/06/2018 09/03/2018 08/10/2018  Decreased Interest 0 0 0 0 0  Down, Depressed, Hopeless 0 0 0 0 0  PHQ - 2 Score 0 0 0 0 0  Altered sleeping 0 - - - -  Tired, decreased energy 0 - - - -  Change in appetite 0 - - - -  Feeling bad or failure about yourself  0 - - - -  Trouble concentrating 0 - - - -  Moving slowly or fidgety/restless 0 - - - -  Suicidal thoughts 0 - - - -  PHQ-9 Score 0 - - - -  Difficult doing work/chores Not difficult at all - - - -   Fall Risk  01/12/2020 06/06/2019 09/06/2018 09/03/2018 08/10/2018  Falls in the past year? 0 0 0 0 0  Number falls in past yr: - 0 - - -  Injury with Fall? - 1 - - -     Functional Status Survey: Is the patient deaf or have difficulty hearing?: No Does the patient have difficulty seeing, even when wearing glasses/contacts?: No Does the patient have difficulty concentrating, remembering, or making decisions?: No Does the patient have difficulty walking or climbing stairs?: No Does the patient have difficulty dressing or bathing?: No Does the patient have difficulty doing  errands alone such as visiting a doctor's office or shopping?: No     Office Visit from 01/12/2020 in [facility]  AUDIT-C Score  0      Diabetic Foot Exam - Simple   Simple Foot Form Diabetic Foot exam was performed with the following findings: Yes 01/12/2020  3:31 PM  Visual Inspection No deformities, no ulcerations, no other skin breakdown bilaterally: Yes Sensation Testing Intact to touch and monofilament testing bilaterally: Yes Pulse Check Posterior Tibialis and Dorsalis pulse intact bilaterally: Yes Comments    Allergies  Allergen Reactions  . Amoxicillin Diarrhea  . Cefprozil   . Hydrochlorothiazide Cough  . Lovastatin     Other reaction(s): Muscle Cramps  . Medrol  [Methylprednisolone]   . Metformin Hcl Diarrhea    Severe Diarrhea  . Metronidazole     diarrhea  . Nitrofurantoin Monohyd Macro     Other reaction(s): Wheezing  . Pravastatin Sodium     Other reaction(s): Muscle Pain  . Tetracycline Hives  . Clarithromycin Rash   Medications: Outpatient Medications Prior to Visit  Medication Sig  . atorvastatin (LIPITOR) 10 MG tablet Take 1 tablet (10 mg total) by mouth at bedtime.  Marland Kitchen CALCIUM CARBONATE-VIT D-MIN PO Take by mouth 2 (two) times daily.  . Cholecalciferol (VITAMIN D) 2000 UNITS tablet Take by mouth daily.  . fluticasone (FLONASE) 50 MCG/ACT nasal spray Place into the nose daily.  Marland Kitchen glipiZIDE (GLUCOTROL XL) 5 MG 24 hr tablet TAKE 1 TABLET BY MOUTH ONCE DAILY. *TAKE30 MINUTES BEFORE THE BIGGESTMEAL OF THE DAY*  . ibuprofen (ADVIL,MOTRIN) 200 MG tablet Take by mouth as needed.  . lidocaine (LIDODERM) 5 % Place 1 patch onto the skin every 12 (twelve) hours. Remove & Discard patch within 12 hours or as directed by MD  . Loratadine 10 MG CAPS Take by mouth daily.  Danielle Jackson ULTRA test strip USE ONE STRIP TO CHECK GLUCOSE ONCE DAILY  . Probiotic Product (FORTIFY DAILY PROBIOTIC PO) Take by mouth.  . [DISCONTINUED] losartan (COZAAR) 50 MG  tablet Take 1 tablet (50 mg total) by mouth daily.  . [DISCONTINUED] azithromycin (ZITHROMAX) 250 MG tablet Take 2 pills on day 1 and 1 pill each of the following 4 days.  . [DISCONTINUED] ciprofloxacin (CIPRO) 250 MG tablet Take 1 tablet (250 mg total) by mouth 2 (two) times daily.  . [DISCONTINUED] traMADol (ULTRAM) 50 MG tablet Take 1 tablet (50 mg total) by mouth every 12 (twelve) hours as needed.   No facility-administered medications prior to visit.    Review of Systems  Respiratory: Negative for cough, chest tightness, shortness of breath and wheezing.   Cardiovascular: Negative for chest  pain, palpitations and leg swelling.  Endocrine: Negative for polydipsia.  Genitourinary: Negative for frequency.  Neurological: Positive for light-headedness. Negative for dizziness, numbness and headaches.    Objective    BP 132/80 (BP Location: Right Arm, Patient Position: Sitting, Cuff Size: Normal)   Pulse 76   Temp (!) 96.9 F (36.1 C) (Skin)   Wt 199 lb (90.3 kg)   SpO2 98%   BMI 34.16 kg/m   Physical Exam Constitutional:      Appearance: Normal appearance.  HENT:     Head: Normocephalic and atraumatic.     Nose: Nose normal.     Mouth/Throat:     Mouth: Mucous membranes are moist.     Pharynx: Oropharynx is clear.     Comments: Gums are red, swollen and irritated around the base of the right lateral upper incisor. No bleeding. Eyes:     Extraocular Movements: Extraocular movements intact.     Conjunctiva/sclera: Conjunctivae normal.     Pupils: Pupils are equal, round, and reactive to light.  Cardiovascular:     Rate and Rhythm: Normal rate and regular rhythm.     Pulses: Normal pulses.     Heart sounds: Normal heart sounds.  Pulmonary:     Effort: Pulmonary effort is normal.     Breath sounds: Normal breath sounds.  Musculoskeletal:     Cervical back: Neck supple.     Right lower leg: Normal. No edema.     Left lower leg: Normal. No edema.  Skin:    General: Skin  is warm and dry.  Neurological:     General: No focal deficit present.     Mental Status: She is alert and oriented to person, place, and time. Mental status is at baseline.     Deep Tendon Reflexes: Reflexes normal.  Psychiatric:        Mood and Affect: Mood normal.        Behavior: Behavior normal.        Thought Content: Thought content normal.        Judgment: Judgment normal.      Assessment & Plan     1. Controlled type 2 diabetes mellitus without complication, without long-term current use of insulin (HCC) FBS in the 113-150 range at home. hgb A1C was 6.5% on 08-02-19. Still taking Glipizide-SR 5 mg qd without hypoglycemic episodes. Continue statin and ARB. Normal foot exam and had eye exam in December 2020. Recheck labs and follow up pending reports. - Comprehensive metabolic panel - Hemoglobin A1c - Lipid Panel With LDL/HDL Ratio - TSH - CBC with Differential/Platelet  2. Benign essential HTN Well controlled on the Losartan 50 mg qd. Still needs to work on weight loss and follow diabetic low sodium diet. Recheck routine labs. - Comprehensive metabolic panel - TSH - CBC with Differential/Platelet - losartan (COZAAR) 50 MG tablet; Take 1 tablet (50 mg total) by mouth daily.  Dispense: 90 tablet; Refill: 3  3. Hypercholesteremia Tolerating the Atorvastatin 10 mg qd without side effects like she did with the Lovastatin and Pravastatin. Continue low fat diet and recheck labs fasting. - Comprehensive metabolic panel - Lipid Panel With LDL/HDL Ratio - TSH  4. Gingivitis Irritation and swelling of gum at the base of the right lateral incisor base with history of root canal last week. Continue follow up with dentist as planned.     Haywood Pao, Jackson, have reviewed all documentation for this visit. The documentation on 01/12/20 for the exam, diagnosis,  procedures, and orders are all accurate and complete.    Vernie Murders, [place] [phone]  (phone) [phone] (fax)  [place]

## 2020-01-12 ENCOUNTER — Other Ambulatory Visit: Payer: Self-pay

## 2020-01-12 ENCOUNTER — Encounter: Payer: Self-pay | Admitting: Family Medicine

## 2020-01-12 ENCOUNTER — Ambulatory Visit (INDEPENDENT_AMBULATORY_CARE_PROVIDER_SITE_OTHER): Payer: PPO | Admitting: Family Medicine

## 2020-01-12 VITALS — BP 132/80 | HR 76 | Temp 96.9°F | Wt 199.0 lb

## 2020-01-12 DIAGNOSIS — K0510 Chronic gingivitis, plaque induced: Secondary | ICD-10-CM

## 2020-01-12 DIAGNOSIS — E7800 Pure hypercholesterolemia, unspecified: Secondary | ICD-10-CM

## 2020-01-12 DIAGNOSIS — I10 Essential (primary) hypertension: Secondary | ICD-10-CM | POA: Diagnosis not present

## 2020-01-12 DIAGNOSIS — E119 Type 2 diabetes mellitus without complications: Secondary | ICD-10-CM | POA: Diagnosis not present

## 2020-01-12 MED ORDER — LOSARTAN POTASSIUM 50 MG PO TABS: 50.0000 mg | ORAL_TABLET | Freq: Every day | ORAL | 3 refills | Status: DC

## 2020-01-19 NOTE — Progress Notes (Addendum)
Subjective:   Danielle Jackson is a 79 y.o. female who presents for Medicare Annual (Subsequent) preventive examination.  I connected with Danielle Jackson today by telephone and verified that I am speaking with the correct person using two identifiers. Location patient: home Location provider: work Persons participating in the virtual visit: patient, provider.   I discussed the limitations, risks, security and privacy concerns of performing an evaluation and management service by telephone and the availability of in person appointments. I also discussed with the patient that there may be a patient responsible charge related to this service. The patient expressed understanding and verbally consented to this telephonic visit.    Interactive audio and video telecommunications were attempted between this provider and patient, however failed, due to patient having technical difficulties OR patient did not have access to video capability.  We continued and completed visit with audio only.  Review of Systems:  N/A  Cardiac Risk Factors include: advanced age (>79men, >58 women);dyslipidemia;hypertension;diabetes mellitus     Objective:     Vitals: There were no vitals taken for this visit.  There is no height or weight on file to calculate BMI.  Advanced Directives 01/23/2020 07/25/2019 09/03/2018 03/07/2015  Does Patient Have a Medical Advance Directive? No No No Yes  Type of Advance Directive - - - Healthcare Power of Attorney  Does patient want to make changes to medical advance directive? - - - No - Patient declined  Copy of Healthcare Power of Attorney in Chart? No - copy requested - - -  Would patient like information on creating a medical advance directive? - No - Patient declined No - Patient declined -    Tobacco Social History   Tobacco Use  Smoking Status Never Smoker  Smokeless Tobacco Never Used     Counseling given: Not Answered   Clinical Intake:  Pre-visit preparation  completed: Yes  Pain : No/denies pain Pain Score: 0-No pain     Nutritional Risks: None Diabetes: Yes  How often do you need to have someone help you when you read instructions, pamphlets, or other written materials from your doctor or pharmacy?: 1 - Never   Diabetes:  Is the patient diabetic?  Yes  If diabetic, was a CBG obtained today?  No  Did the patient bring in their glucometer from home?  No  How often do you monitor your CBG's? Once a day in morning.   Financial Strains and Diabetes Management:  Are you having any financial strains with the device, your supplies or your medication? No .  Does the patient want to be seen by Chronic Care Management for management of their diabetes?  No  Would the patient like to be referred to a Nutritionist or for Diabetic Management?  No   Diabetic Exams:  Diabetic Eye Exam: Completed 08/03/19. Repeat yearly.  Diabetic Foot Exam: Completed 01/12/20. Repeat yearly.   Interpreter Needed?: No  Information entered by :: [facility], LPN  Past Medical History:  Diagnosis Date   Diabetes mellitus without complication (HCC)    Hyperlipidemia    Hypertension    Past Surgical History:  Procedure Laterality Date   ABDOMINAL HYSTERECTOMY  1975   CATARACT EXTRACTION  03/01/2001   KNEE SURGERY  03/24/2014   Family History  Problem Relation Age of Onset   Diabetes Mother    Heart disease Mother    Hyperlipidemia Mother    Hypertension Mother    Arthritis Mother    Mental illness Mother  Asthma Father    Cancer Father    Hypertension Sister    Asthma Brother    Social History   Socioeconomic History   Marital status: Married    Spouse name: Not on file   Number of children: 2   Years of education: Not on file   Highest education level: High school graduate  Occupational History   Not on file  Tobacco Use   Smoking status: Never Smoker   Smokeless tobacco: Never Used  Substance and Sexual Activity   Alcohol use: No     Alcohol/week: 0.0 standard drinks   Drug use: No   Sexual activity: Not on file  Other Topics Concern   Not on file  Social History Narrative   Not on file   Social Determinants of Health   Financial Resource Strain: Low Risk    Difficulty of Paying Living Expenses: Not hard at all  Food Insecurity: No Food Insecurity   Worried About Programme researcher, broadcasting/film/video in the Last Year: Never true   Ran Out of Food in the Last Year: Never true  Transportation Needs: No Transportation Needs   Lack of Transportation (Medical): No   Lack of Transportation (Non-Medical): No  Physical Activity: Inactive   Days of Exercise per Week: 0 days   Minutes of Exercise per Session: 0 min  Stress: No Stress Concern Present   Feeling of Stress : Not at all  Social Connections: Slightly Isolated   Frequency of Communication with Friends and Family: Three times a week   Frequency of Social Gatherings with Friends and Family: More than three times a week   Attends Religious Services: More than 4 times per year   Active Member of Clubs or Organizations: No   Attends Banker Meetings: Never   Marital Status: Married    Outpatient Encounter Medications as of 01/23/2020  Medication Sig   atorvastatin (LIPITOR) 10 MG tablet Take 1 tablet (10 mg total) by mouth at bedtime.   CALCIUM CARBONATE-VIT D-MIN PO Take by mouth 2 (two) times daily.   Cholecalciferol (VITAMIN D) 2000 UNITS tablet Take by mouth daily.   fluticasone (FLONASE) 50 MCG/ACT nasal spray Place into the nose daily.   glipiZIDE (GLUCOTROL XL) 5 MG 24 hr tablet TAKE 1 TABLET BY MOUTH ONCE DAILY. *TAKE30 MINUTES BEFORE THE BIGGESTMEAL OF THE DAY*   ibuprofen (ADVIL,MOTRIN) 200 MG tablet Take by mouth as needed.   Loratadine 10 MG CAPS Take by mouth daily. As needed   losartan (COZAAR) 50 MG tablet Take 1 tablet (50 mg total) by mouth daily.   ONETOUCH ULTRA test strip USE ONE STRIP TO CHECK GLUCOSE ONCE DAILY   lidocaine (LIDODERM) 5 %  Place 1 patch onto the skin every 12 (twelve) hours. Remove & Discard patch within 12 hours or as directed by MD (Patient not taking: Reported on 01/23/2020)   Probiotic Product (FORTIFY DAILY PROBIOTIC PO) Take by mouth.   No facility-administered encounter medications on file as of 01/23/2020.    Activities of Daily Living In your present state of health, do you have any difficulty performing the following activities: 01/23/2020 01/12/2020  Hearing? N N  Vision? N N  Difficulty concentrating or making decisions? N N  Walking or climbing stairs? Y N  Comment Occasionally knee soreness. -  Dressing or bathing? N N  Doing errands, shopping? N N  Preparing Food and eating ? N -  Using the Toilet? N -  In the past six  months, have you accidently leaked urine? N -  Do you have problems with loss of bowel control? N -  Managing your Medications? N -  Managing your Finances? N -  Housekeeping or managing your Housekeeping? N -  Some recent data might be hidden    Patient Care Team: Chrismon, Vickki Muff, PA as PCP - General (Family Medicine) Cleaster Corin, OD (Optometry)    Assessment:   This is a routine wellness examination for Danielle Jackson.  Exercise Activities and Dietary recommendations Current Exercise Habits: The patient does not participate in regular exercise at present, Exercise limited by: None identified  Goals      DIET - INCREASE WATER INTAKE     Recommend to drink at least 6-8 8oz glasses of water per day.        Fall Risk: Fall Risk  01/23/2020 01/12/2020 06/06/2019 09/06/2018 09/03/2018  Falls in the past year? 0 0 0 0 0  Number falls in past yr: 0 - 0 - -  Injury with Fall? 0 - 1 - -    FALL RISK PREVENTION PERTAINING TO THE HOME:  Any stairs in or around the home? Yes  If so, are there any without handrails? No   Home free of loose throw rugs in walkways, pet beds, electrical cords, etc? Yes  Adequate lighting in your home to reduce risk of falls? Yes   ASSISTIVE  DEVICES UTILIZED TO PREVENT FALLS:  Life alert? No  Use of a cane, walker or w/c? No  Grab bars in the bathroom? Yes  Shower chair or bench in shower? Yes  Elevated toilet seat or a handicapped toilet? Yes   TIMED UP AND GO:  Was the test performed? No .    Depression Screen PHQ 2/9 Scores 01/12/2020 06/06/2019 09/06/2018 09/03/2018  PHQ - 2 Score 0 0 0 0  PHQ- 9 Score 0 - - -     Cognitive Function: Declined today.        Immunization History  Administered Date(s) Administered   Influenza Split 06/25/2010, 05/20/2011   Influenza, High Dose Seasonal PF 06/06/2014, 06/08/2015, 06/03/2016, 06/24/2017, 07/02/2019   Influenza,inj,Quad PF,6+ Mos 05/03/2013   Influenza-Unspecified 06/01/2014, 06/03/2016, 07/01/2018   Pneumococcal Conjugate-13 06/06/2014   Pneumococcal Polysaccharide-23 07/03/2007   Td 08/02/2004   Tdap 05/20/2011   Zoster 01/04/2013    Qualifies for Shingles Vaccine? Yes  Zostavax completed 5/6/4. Due for Shingrix. Pt has been advised to call insurance company to determine out of pocket expense. Advised may also receive vaccine at local pharmacy or Health Dept. Verbalized acceptance and understanding.  Tdap: Up to date  Flu Vaccine: Up to date  Pneumococcal Vaccine: Completed series  Screening Tests Health Maintenance  Topic Date Due   COVID-19 Vaccine (1) Never done   HEMOGLOBIN A1C  01/31/2020   INFLUENZA VACCINE  04/01/2020   OPHTHALMOLOGY EXAM  08/02/2020   FOOT EXAM  01/11/2021   TETANUS/TDAP  05/19/2021   DEXA SCAN  06/15/2022   PNA vac Low Risk Adult  Completed    Cancer Screenings:  Colorectal Screening: No longer required.   Mammogram: No longer required.   Bone Density: Completed 06/15/17. Results reflect OSTEOPENIA. Repeat every 5 years.   Lung Cancer Screening: (Low Dose CT Chest recommended if Age 16-80 years, 30 pack-year currently smoking OR have quit w/in 15years.) does not qualify.   Additional Screening:  Dental Screening:  Recommended annual dental exams for proper oral hygiene   Community Resource Referral:  CRR required this visit?  No     Plan:  I have personally reviewed and addressed the Medicare Annual Wellness questionnaire and have noted the following in the patient's chart:  A. Medical and social history B. Use of alcohol, tobacco or illicit drugs  C. Current medications and supplements D. Functional ability and status E.  Nutritional status F.  Physical activity G. Advance directives H. List of other physicians I.  Hospitalizations, surgeries, and ER visits in previous 12 months J.  Vitals K. Screenings such as hearing and vision if needed, cognitive and depression L. Referrals and appointments   In addition, I have reviewed and discussed with patient certain preventive protocols, quality metrics, and best practice recommendations. A written personalized care plan for preventive services as well as general preventive health recommendations were provided to patient.   Darrick Huntsman, [place]  7/89/3810 Nurse Health Advisor   Nurse Notes: None.  Reviewed screening note from Nurse Health Advisor. Was available for consultation. Agree with documentation and recommendations.

## 2020-01-23 ENCOUNTER — Ambulatory Visit (INDEPENDENT_AMBULATORY_CARE_PROVIDER_SITE_OTHER): Payer: PPO

## 2020-01-23 ENCOUNTER — Other Ambulatory Visit: Payer: Self-pay

## 2020-01-23 DIAGNOSIS — Z0000 Encounter for general adult medical examination without abnormal findings: Secondary | ICD-10-CM

## 2020-01-23 NOTE — Patient Instructions (Signed)
Danielle Jackson , Thank you for taking time to come for your Medicare Wellness Visit. I appreciate your ongoing commitment to your health goals. Please review the following plan we discussed and let me know if I can assist you in the future.   Screening recommendations/referrals: Colonoscopy: No longer required.  Mammogram: No longer required.  Bone Density: Up to date, due 06/2022 Recommended yearly ophthalmology/optometry visit for glaucoma screening and checkup Recommended yearly dental visit for hygiene and checkup  Vaccinations: Influenza vaccine: Up to date Pneumococcal vaccine: Completed series Tdap vaccine: Up to date, due 05/2021 Shingles vaccine: Pt declines today.     Advanced directives: Advance directive discussed with you today. Even though you declined this today please call our office should you change your mind and we can give you the proper paperwork for you to fill out.  Conditions/risks identified: Recommend to drink at least 6-8 8oz glasses of water per day.  Next appointment: None, declined scheduling an AWV for 2022 at this time. Also declined scheduling a f/u with Maurine Minister until after completing blood work that was ordered previously.   Preventive Care 5 Years and Older, Female Preventive care refers to lifestyle choices and visits with your health care provider that can promote health and wellness. What does preventive care include?  A yearly physical exam. This is also called an annual well check.  Dental exams once or twice a year.  Routine eye exams. Ask your health care provider how often you should have your eyes checked.  Personal lifestyle choices, including:  Daily care of your teeth and gums.  Regular physical activity.  Eating a healthy diet.  Avoiding tobacco and drug use.  Limiting alcohol use.  Practicing safe sex.  Taking low-dose aspirin every day.  Taking vitamin and mineral supplements as recommended by your health care  provider. What happens during an annual well check? The services and screenings done by your health care provider during your annual well check will depend on your age, overall health, lifestyle risk factors, and family history of disease. Counseling  Your health care provider may ask you questions about your:  Alcohol use.  Tobacco use.  Drug use.  Emotional well-being.  Home and relationship well-being.  Sexual activity.  Eating habits.  History of falls.  Memory and ability to understand (cognition).  Work and work Astronomer.  Reproductive health. Screening  You may have the following tests or measurements:  Height, weight, and BMI.  Blood pressure.  Lipid and cholesterol levels. These may be checked every 5 years, or more frequently if you are over 76 years old.  Skin check.  Lung cancer screening. You may have this screening every year starting at age 50 if you have a 30-pack-year history of smoking and currently smoke or have quit within the past 15 years.  Fecal occult blood test (FOBT) of the stool. You may have this test every year starting at age 57.  Flexible sigmoidoscopy or colonoscopy. You may have a sigmoidoscopy every 5 years or a colonoscopy every 10 years starting at age 45.  Hepatitis C blood test.  Hepatitis B blood test.  Sexually transmitted disease (STD) testing.  Diabetes screening. This is done by checking your blood sugar (glucose) after you have not eaten for a while (fasting). You may have this done every 1-3 years.  Bone density scan. This is done to screen for osteoporosis. You may have this done starting at age 35.  Mammogram. This may be done every 1-2 years.  Talk to your health care provider about how often you should have regular mammograms. Talk with your health care provider about your test results, treatment options, and if necessary, the need for more tests. Vaccines  Your health care provider may recommend certain  vaccines, such as:  Influenza vaccine. This is recommended every year.  Tetanus, diphtheria, and acellular pertussis (Tdap, Td) vaccine. You may need a Td booster every 10 years.  Zoster vaccine. You may need this after age 23.  Pneumococcal 13-valent conjugate (PCV13) vaccine. One dose is recommended after age 22.  Pneumococcal polysaccharide (PPSV23) vaccine. One dose is recommended after age 70. Talk to your health care provider about which screenings and vaccines you need and how often you need them. This information is not intended to replace advice given to you by your health care provider. Make sure you discuss any questions you have with your health care provider. Document Released: 09/14/2015 Document Revised: 05/07/2016 Document Reviewed: 06/19/2015 Elsevier Interactive Patient Education  2017 [place] Prevention in the Home Falls can cause injuries. They can happen to people of all ages. There are many things you can do to make your home safe and to help prevent falls. What can I do on the outside of my home?  Regularly fix the edges of walkways and driveways and fix any cracks.  Remove anything that might make you trip as you walk through a door, such as a raised step or threshold.  Trim any bushes or trees on the path to your home.  Use bright outdoor lighting.  Clear any walking paths of anything that might make someone trip, such as rocks or tools.  Regularly check to see if handrails are loose or broken. Make sure that both sides of any steps have handrails.  Any raised decks and porches should have guardrails on the edges.  Have any leaves, snow, or ice cleared regularly.  Use sand or salt on walking paths during winter.  Clean up any spills in your garage right away. This includes oil or grease spills. What can I do in the bathroom?  Use night lights.  Install grab bars by the toilet and in the tub and shower. Do not use towel bars as grab  bars.  Use non-skid mats or decals in the tub or shower.  If you need to sit down in the shower, use a plastic, non-slip stool.  Keep the floor dry. Clean up any water that spills on the floor as soon as it happens.  Remove soap buildup in the tub or shower regularly.  Attach bath mats securely with double-sided non-slip rug tape.  Do not have throw rugs and other things on the floor that can make you trip. What can I do in the bedroom?  Use night lights.  Make sure that you have a light by your bed that is easy to reach.  Do not use any sheets or blankets that are too big for your bed. They should not hang down onto the floor.  Have a firm chair that has side arms. You can use this for support while you get dressed.  Do not have throw rugs and other things on the floor that can make you trip. What can I do in the kitchen?  Clean up any spills right away.  Avoid walking on wet floors.  Keep items that you use a lot in easy-to-reach places.  If you need to reach something above you, use a strong step stool  that has a grab bar.  Keep electrical cords out of the way.  Do not use floor polish or wax that makes floors slippery. If you must use wax, use non-skid floor wax.  Do not have throw rugs and other things on the floor that can make you trip. What can I do with my stairs?  Do not leave any items on the stairs.  Make sure that there are handrails on both sides of the stairs and use them. Fix handrails that are broken or loose. Make sure that handrails are as long as the stairways.  Check any carpeting to make sure that it is firmly attached to the stairs. Fix any carpet that is loose or worn.  Avoid having throw rugs at the top or bottom of the stairs. If you do have throw rugs, attach them to the floor with carpet tape.  Make sure that you have a light switch at the top of the stairs and the bottom of the stairs. If you do not have them, ask someone to add them for  you. What else can I do to help prevent falls?  Wear shoes that:  Do not have high heels.  Have rubber bottoms.  Are comfortable and fit you well.  Are closed at the toe. Do not wear sandals.  If you use a stepladder:  Make sure that it is fully opened. Do not climb a closed stepladder.  Make sure that both sides of the stepladder are locked into place.  Ask someone to hold it for you, if possible.  Clearly mark and make sure that you can see:  Any grab bars or handrails.  First and last steps.  Where the edge of each step is.  Use tools that help you move around (mobility aids) if they are needed. These include:  Canes.  Walkers.  Scooters.  Crutches.  Turn on the lights when you go into a dark area. Replace any light bulbs as soon as they burn out.  Set up your furniture so you have a clear path. Avoid moving your furniture around.  If any of your floors are uneven, fix them.  If there are any pets around you, be aware of where they are.  Review your medicines with your doctor. Some medicines can make you feel dizzy. This can increase your chance of falling. Ask your doctor what other things that you can do to help prevent falls. This information is not intended to replace advice given to you by your health care provider. Make sure you discuss any questions you have with your health care provider. Document Released: 06/14/2009 Document Revised: 01/24/2016 Document Reviewed: 09/22/2014 Elsevier Interactive Patient Education  2017 [employer].

## 2020-02-09 DIAGNOSIS — E119 Type 2 diabetes mellitus without complications: Secondary | ICD-10-CM | POA: Diagnosis not present

## 2020-02-09 DIAGNOSIS — I10 Essential (primary) hypertension: Secondary | ICD-10-CM | POA: Diagnosis not present

## 2020-02-09 DIAGNOSIS — E7800 Pure hypercholesterolemia, unspecified: Secondary | ICD-10-CM | POA: Diagnosis not present

## 2020-02-10 LAB — CBC WITH DIFFERENTIAL/PLATELET
Basophils Absolute: 0 10*3/uL (ref 0.0–0.2)
Basos: 1 %
EOS (ABSOLUTE): 0.2 10*3/uL (ref 0.0–0.4)
Eos: 3 %
Hematocrit: 39.8 % (ref 34.0–46.6)
Hemoglobin: 13.7 g/dL (ref 11.1–15.9)
Immature Grans (Abs): 0 10*3/uL (ref 0.0–0.1)
Immature Granulocytes: 0 %
Lymphocytes Absolute: 2.1 10*3/uL (ref 0.7–3.1)
Lymphs: 33 %
MCH: 33.2 pg — ABNORMAL HIGH (ref 26.6–33.0)
MCHC: 34.4 g/dL (ref 31.5–35.7)
MCV: 96 fL (ref 79–97)
Monocytes Absolute: 0.5 10*3/uL (ref 0.1–0.9)
Monocytes: 9 %
Neutrophils Absolute: 3.5 10*3/uL (ref 1.4–7.0)
Neutrophils: 54 %
Platelets: 198 10*3/uL (ref 150–450)
RBC: 4.13 x10E6/uL (ref 3.77–5.28)
RDW: 13.3 % (ref 11.7–15.4)
WBC: 6.4 10*3/uL (ref 3.4–10.8)

## 2020-02-10 LAB — HEMOGLOBIN A1C
Est. average glucose Bld gHb Est-mCnc: 143 mg/dL
Hgb A1c MFr Bld: 6.6 % — ABNORMAL HIGH (ref 4.8–5.6)

## 2020-02-10 LAB — COMPREHENSIVE METABOLIC PANEL
ALT: 16 IU/L (ref 0–32)
AST: 20 IU/L (ref 0–40)
Albumin/Globulin Ratio: 1.5 (ref 1.2–2.2)
Albumin: 4.1 g/dL (ref 3.7–4.7)
Alkaline Phosphatase: 92 IU/L (ref 48–121)
BUN/Creatinine Ratio: 22 (ref 12–28)
BUN: 24 mg/dL (ref 8–27)
Bilirubin Total: 0.4 mg/dL (ref 0.0–1.2)
CO2: 19 mmol/L — ABNORMAL LOW (ref 20–29)
Calcium: 9.3 mg/dL (ref 8.7–10.3)
Chloride: 107 mmol/L — ABNORMAL HIGH (ref 96–106)
Creatinine, Ser: 1.07 mg/dL — ABNORMAL HIGH (ref 0.57–1.00)
GFR calc Af Amer: 58 mL/min/{1.73_m2} — ABNORMAL LOW (ref >59)
GFR calc non Af Amer: 50 mL/min/{1.73_m2} — ABNORMAL LOW (ref >59)
Globulin, Total: 2.7 g/dL (ref 1.5–4.5)
Glucose: 107 mg/dL — ABNORMAL HIGH (ref 65–99)
Potassium: 3.8 mmol/L (ref 3.5–5.2)
Sodium: 141 mmol/L (ref 134–144)
Total Protein: 6.8 g/dL (ref 6.0–8.5)

## 2020-02-10 LAB — LIPID PANEL WITH LDL/HDL RATIO
Cholesterol, Total: 154 mg/dL (ref 100–199)
HDL: 44 mg/dL (ref >39)
LDL Chol Calc (NIH): 67 mg/dL (ref 0–99)
LDL/HDL Ratio: 1.5 ratio (ref 0.0–3.2)
Triglycerides: 272 mg/dL — ABNORMAL HIGH (ref 0–149)
VLDL Cholesterol Cal: 43 mg/dL — ABNORMAL HIGH (ref 5–40)

## 2020-02-10 LAB — TSH: TSH: 2.34 u[IU]/mL (ref 0.450–4.500)

## 2020-02-27 ENCOUNTER — Other Ambulatory Visit: Payer: Self-pay | Admitting: Family Medicine

## 2020-02-27 DIAGNOSIS — E119 Type 2 diabetes mellitus without complications: Secondary | ICD-10-CM

## 2020-05-04 ENCOUNTER — Other Ambulatory Visit: Payer: Self-pay | Admitting: Family Medicine

## 2020-05-04 ENCOUNTER — Ambulatory Visit: Payer: Self-pay

## 2020-05-04 DIAGNOSIS — K5732 Diverticulitis of large intestine without perforation or abscess without bleeding: Secondary | ICD-10-CM

## 2020-05-04 MED ORDER — METRONIDAZOLE 500 MG PO TABS: 500.0000 mg | ORAL_TABLET | Freq: Two times a day (BID) | ORAL | 0 refills | Status: AC

## 2020-05-04 MED ORDER — CIPROFLOXACIN HCL 500 MG PO TABS: 500.0000 mg | ORAL_TABLET | Freq: Two times a day (BID) | ORAL | 0 refills | Status: AC

## 2020-05-04 NOTE — Telephone Encounter (Signed)
Have sent prescription for ciprofloxacin and metronidazole for diverticulitis.  She needs to go to ER. If sx worsen, if she runs fever over 102, or if unable to keep down antibiotics.

## 2020-05-04 NOTE — Telephone Encounter (Signed)
Pt. Reports she has a history of diverticulitis. Having abdominal pain to left lower quadrant.Started Wednesday and has gotten worse. Has diarrhea. No fever. States "Cipro usually clears it up." No availability in the practice today. Practice closed for lunch. States "I don't want to go to the ED." Please advise pt.  Reason for Disposition . [1] MILD-MODERATE pain AND [2] constant AND [3] present > 2 hours  Answer Assessment - Initial Assessment Questions 1. LOCATION: "Where does it hurt?"      Left lower 2. RADIATION: "Does the pain shoot anywhere else?" (e.g., chest, back)     Back 3. ONSET: "When did the pain begin?" (e.g., minutes, hours or days ago)      Wednesday 4. SUDDEN: "Gradual or sudden onset?"     Gradual 5. PATTERN "Does the pain come and go, or is it constant?"    - If constant: "Is it getting better, staying the same, or worsening?"      (Note: Constant means the pain never goes away completely; most serious pain is constant and it progresses)     - If intermittent: "How long does it last?" "Do you have pain now?"     (Note: Intermittent means the pain goes away completely between bouts)     Comes and goes 6. SEVERITY: "How bad is the pain?"  (e.g., Scale 1-10; mild, moderate, or severe)   - MILD (1-3): doesn't interfere with normal activities, abdomen soft and not tender to touch    - MODERATE (4-7): interferes with normal activities or awakens from sleep, tender to touch    - SEVERE (8-10): excruciating pain, doubled over, unable to do any normal activities      Now - 5 7. RECURRENT SYMPTOM: "Have you ever had this type of stomach pain before?" If Yes, ask: "When was the last time?" and "What happened that time?"      Yes 8. CAUSE: "What do you think is causing the stomach pain?"     Diverticulitis 9. RELIEVING/AGGRAVATING FACTORS: "What makes it better or worse?" (e.g., movement, antacids, bowel movement)     No 10. OTHER SYMPTOMS: "Has there been any vomiting,  diarrhea, constipation, or urine problems?"       Diarrhea 11. PREGNANCY: "Is there any chance you are pregnant?" "When was your last menstrual period?"       No  Protocols used: ABDOMINAL PAIN - [facility]

## 2020-05-04 NOTE — Telephone Encounter (Signed)
Patient advised and verbalized understanding 

## 2020-09-10 ENCOUNTER — Other Ambulatory Visit: Payer: Self-pay | Admitting: Family Medicine

## 2020-09-10 DIAGNOSIS — E119 Type 2 diabetes mellitus without complications: Secondary | ICD-10-CM

## 2020-11-26 ENCOUNTER — Other Ambulatory Visit: Payer: Self-pay | Admitting: Family Medicine

## 2020-11-26 DIAGNOSIS — Z1231 Encounter for screening mammogram for malignant neoplasm of breast: Secondary | ICD-10-CM

## 2020-12-11 ENCOUNTER — Other Ambulatory Visit: Payer: Self-pay | Admitting: Family Medicine

## 2020-12-11 DIAGNOSIS — E7800 Pure hypercholesterolemia, unspecified: Secondary | ICD-10-CM

## 2020-12-12 ENCOUNTER — Ambulatory Visit
Admission: RE | Admit: 2020-12-12 | Discharge: 2020-12-12 | Disposition: A | Discharge status: Home / Self Care | Payer: PPO | Source: Ambulatory Visit | Attending: Family Medicine | Admitting: Family Medicine

## 2020-12-12 ENCOUNTER — Other Ambulatory Visit: Payer: Self-pay

## 2020-12-12 DIAGNOSIS — Z1231 Encounter for screening mammogram for malignant neoplasm of breast: Secondary | ICD-10-CM | POA: Insufficient documentation

## 2021-01-03 ENCOUNTER — Ambulatory Visit (INDEPENDENT_AMBULATORY_CARE_PROVIDER_SITE_OTHER): Payer: PPO | Admitting: Family Medicine

## 2021-01-03 ENCOUNTER — Encounter: Payer: Self-pay | Admitting: Family Medicine

## 2021-01-03 ENCOUNTER — Other Ambulatory Visit: Payer: Self-pay

## 2021-01-03 VITALS — BP 149/57 | HR 73 | Temp 98.2°F | Wt 200.0 lb

## 2021-01-03 DIAGNOSIS — I10 Essential (primary) hypertension: Secondary | ICD-10-CM | POA: Diagnosis not present

## 2021-01-03 DIAGNOSIS — E119 Type 2 diabetes mellitus without complications: Secondary | ICD-10-CM | POA: Diagnosis not present

## 2021-01-03 DIAGNOSIS — E7800 Pure hypercholesterolemia, unspecified: Secondary | ICD-10-CM

## 2021-01-03 MED ORDER — GLIPIZIDE ER 5 MG PO TB24: ORAL_TABLET | ORAL | 3 refills | Status: DC

## 2021-01-03 MED ORDER — LOSARTAN POTASSIUM 50 MG PO TABS: 50.0000 mg | ORAL_TABLET | Freq: Every day | ORAL | 3 refills | Status: DC

## 2021-01-03 NOTE — Progress Notes (Signed)
Established patient visit   Patient: Danielle Jackson   DOB: 25-Nov-1940   80 y.o. Female  MRN: [id number] Visit Date: 01/03/2021  Today's healthcare provider: [name], PA-C   Chief Complaint  Patient presents with  . Hypertension  . Hyperlipidemia  . Diabetes   Subjective    HPI  Diabetes Mellitus Type II, follow-up  Lab Results  Component Value Date   HGBA1C 6.6 (H) 02/09/2020   HGBA1C 6.5 (H) 08/02/2019   HGBA1C 6.8 (A) 11/30/2018   Last seen for diabetes 11 months ago.  Management since then includes continuing the same treatment. She reports excellent compliance with treatment. She is not having side effects.   Home blood sugar records: fasting range: low to mid 100's  Episodes of hypoglycemia? No    Current insulin regiment: none Most Recent Eye Exam: Pt is due for an eye exam.  --------------------------------------------------------------------------------------------------- Hypertension, follow-up  BP Readings from Last 3 Encounters:  01/03/21 (!) 149/57  01/12/20 132/80  10/12/19 140/88   Wt Readings from Last 3 Encounters:  01/03/21 200 lb (90.7 kg)  01/12/20 199 lb (90.3 kg)  08/02/19 196 lb (88.9 kg)     She was last seen for hypertension 11 months ago.  She reports excellent compliance with treatment. She is not having side effects.  She is not exercising. She is adherent to low salt diet.   Outside blood pressures are checked occasionally.  Pt states it is usually normal.  She does not smoke.  Use of agents associated with hypertension: none.   --------------------------------------------------------------------------------------------------- Lipid/Cholesterol, follow-up  Last Lipid Panel: Lab Results  Component Value Date   CHOL 154 02/09/2020   LDLCALC 67 02/09/2020   HDL 44 02/09/2020   TRIG 272 (H) 02/09/2020    She was last seen for this 11 months ago.  Management since that visit includes no changes.  She  reports excellent compliance with treatment. She is not having side effects.   Symptoms: No appetite changes No foot ulcerations  No chest pain No chest pressure/discomfort  No dyspnea No orthopnea  No fatigue No lower extremity edema  No palpitations No paroxysmal nocturnal dyspnea  No nausea No numbness or tingling of extremity  No polydipsia No polyuria  No speech difficulty No syncope   She is following a Low Sodium diet. Current exercise: none  Last metabolic panel Lab Results  Component Value Date   GLUCOSE 107 (H) 02/09/2020   NA 141 02/09/2020   K 3.8 02/09/2020   BUN 24 02/09/2020   CREATININE 1.07 (H) 02/09/2020   GFRNONAA 50 (L) 02/09/2020   GFRAA 58 (L) 02/09/2020   CALCIUM 9.3 02/09/2020   AST 20 02/09/2020   ALT 16 02/09/2020   The 10-year ASCVD risk score Denman George DC Jr., et al., 2013) is: 57.4%  --------------------------------------------------------------------------------------------------- Past Surgical History:  Procedure Laterality Date  . ABDOMINAL HYSTERECTOMY  1975  . CATARACT EXTRACTION  03/01/2001  . KNEE SURGERY  03/24/2014   Family History  Problem Relation Age of Onset  . Diabetes Mother   . Heart disease Mother   . Hyperlipidemia Mother   . Hypertension Mother   . Arthritis Mother   . Mental illness Mother   . Asthma Father   . Cancer Father   . Hypertension Sister   . Asthma Brother   . Breast cancer Neg Hx      Patient Active Problem List   Diagnosis Date Noted  . S/P arthroscopic surgery of right knee  05/11/2018  . Allergic rhinitis 01/27/2015  . Benign essential HTN 01/27/2015  . Controlled diabetes mellitus type II without complication (HCC) 01/27/2015  . Diverticulosis of colon 01/27/2015  . Hypercholesteremia 01/27/2015  . Adiposity 01/27/2015  . Osteopenia 01/27/2015  . Avitaminosis D 01/27/2015   Past Medical History:  Diagnosis Date  . Diabetes mellitus without complication (HCC)   . Hyperlipidemia   .  Hypertension    Social History   Tobacco Use  . Smoking status: Never Smoker  . Smokeless tobacco: Never Used  Substance Use Topics  . Alcohol use: No    Alcohol/week: 0.0 standard drinks  . Drug use: No   Allergies  Allergen Reactions  . Amoxicillin Diarrhea  . Cefprozil   . Hydrochlorothiazide Cough  . Lovastatin     Other reaction(s): Muscle Cramps  . Medrol  [Methylprednisolone]   . Metformin Hcl Diarrhea    Severe Diarrhea  . Metronidazole     diarrhea  . Nitrofurantoin Monohyd Macro     Other reaction(s): Wheezing  . Pravastatin Sodium     Other reaction(s): Muscle Pain  . Tetracycline Hives  . Clarithromycin Rash     Medications: Outpatient Medications Prior to Visit  Medication Sig  . atorvastatin (LIPITOR) 10 MG tablet TAKE 1 TABLET BY MOUTH EVERY NIGHT AT BEDTIME  . CALCIUM CARBONATE-VIT D-MIN PO Take by mouth 2 (two) times daily.  . Cholecalciferol (VITAMIN D) 2000 UNITS tablet Take by mouth daily.  . fluticasone (FLONASE) 50 MCG/ACT nasal spray Place into the nose daily.  Marland Kitchen glipiZIDE (GLUCOTROL XL) 5 MG 24 hr tablet TAKE 1 TABLET BY MOUTH ONCE A DAY  . ibuprofen (ADVIL,MOTRIN) 200 MG tablet Take by mouth as needed.  . Loratadine 10 MG CAPS Take by mouth daily. As needed  . losartan (COZAAR) 50 MG tablet Take 1 tablet (50 mg total) by mouth daily.  Letta Pate ULTRA test strip USE ONE STRIP TO CHECK GLUCOSE ONCE DAILY  . Probiotic Product (FORTIFY DAILY PROBIOTIC PO) Take by mouth.   No facility-administered medications prior to visit.    Review of Systems  Constitutional: Negative.   Respiratory: Negative.   Cardiovascular: Negative.   Gastrointestinal: Negative.   Endocrine: Negative.   Skin: Negative for wound.  Neurological: Negative for dizziness, syncope, light-headedness and headaches.     Objective    BP (!) 149/57 (BP Location: Right Arm, Patient Position: Sitting, Cuff Size: Large)   Pulse 73   Temp 98.2 F (36.8 C) (Oral)   Wt 200  lb (90.7 kg)   BMI 34.33 kg/m    Physical Exam Constitutional:      General: She is not in acute distress.    Appearance: She is well-developed.  HENT:     Head: Normocephalic and atraumatic.     Right Ear: Hearing normal.     Left Ear: Hearing normal.     Nose: Nose normal.  Eyes:     General: Lids are normal. No scleral icterus.       Right eye: No discharge.        Left eye: No discharge.     Conjunctiva/sclera: Conjunctivae normal.  Neck:     Vascular: No carotid bruit.  Cardiovascular:     Rate and Rhythm: Normal rate and regular rhythm.     Pulses: Normal pulses.     Heart sounds: Normal heart sounds.  Pulmonary:     Effort: Pulmonary effort is normal. No respiratory distress.     Breath sounds:  Normal breath sounds.  Abdominal:     General: Bowel sounds are normal.     Palpations: Abdomen is soft.  Musculoskeletal:        General: Normal range of motion.     Cervical back: Neck supple.  Skin:    Findings: No lesion or rash.  Neurological:     Mental Status: She is alert and oriented to person, place, and time.  Psychiatric:        Speech: Speech normal.        Behavior: Behavior normal.        Thought Content: Thought content normal.     Diabetic Foot Form - Detailed   Diabetic Foot Exam - detailed Diabetic Foot exam was performed with the following findings: Yes 01/03/2021 11:27 AM  Visual Foot Exam completed.: Yes  Can the patient see the bottom of their feet?: Yes Is there swelling or and abnormal foot shape?: No Is there a claw toe deformity?: No Is there elevated skin temparature?: No Is there foot or ankle muscle weakness?: No Normal Range of Motion: Yes Pulse Foot Exam completed.: Yes  Right posterior Tibialias: Present Left posterior Tibialias: Present  Right Dorsalis Pedis: Present Left Dorsalis Pedis: Present  Sensory Foot Exam Completed.: Yes Semmes-Weinstein Monofilament Test R Site 1-Great Toe: Pos L Site 1-Great Toe: Pos         No  results found for any visits on 01/03/21.  Assessment & Plan     1. Controlled type 2 diabetes mellitus without complication, without long-term current use of insulin (HCC) BS averaging in the low 100's recently. No hypoglycemic episodes. No polyuria, polydipsia or vision changes. Advised to see ophthalmologist annually. Normal foot exam. Get follow up labs and refilled meds. - glipiZIDE (GLUCOTROL XL) 5 MG 24 hr tablet; TAKE 1 TABLET BY MOUTH ONCE A DAY  Dispense: 90 tablet; Refill: 3 - CBC with Differential/Platelet - Comprehensive metabolic panel - Lipid panel - TSH - Hemoglobin A1c - Urine Microalbumin w/creat. ratio  2. Benign essential HTN Good control of BP. No chest pains or palpitations. Tolerating Losartan without side effects. Recheck labs. - losartan (COZAAR) 50 MG tablet; Take 1 tablet (50 mg total) by mouth daily.  Dispense: 90 tablet; Refill: 3 - Comprehensive metabolic panel - Lipid panel - TSH  3. Hypercholesteremia Tolerating Atorvastatin without side effects. Continue low fat diet and recheck labs. - Comprehensive metabolic panel - Lipid panel - Hemoglobin A1c   No follow-ups on file.      I,  , PA-C, have reviewed all documentation for this visit. The documentation on 01/03/21 for the exam, diagnosis, procedures, and orders are all accurate and complete.    [name], PA-C  [facility] [phone] (phone) [phone] (fax)  [facility]

## 2021-01-14 DIAGNOSIS — E7800 Pure hypercholesterolemia, unspecified: Secondary | ICD-10-CM | POA: Diagnosis not present

## 2021-01-14 DIAGNOSIS — E119 Type 2 diabetes mellitus without complications: Secondary | ICD-10-CM | POA: Diagnosis not present

## 2021-01-14 DIAGNOSIS — I10 Essential (primary) hypertension: Secondary | ICD-10-CM | POA: Diagnosis not present

## 2021-01-15 LAB — COMPREHENSIVE METABOLIC PANEL
ALT: 19 IU/L (ref 0–32)
AST: 23 IU/L (ref 0–40)
Albumin/Globulin Ratio: 1.6 (ref 1.2–2.2)
Albumin: 4.2 g/dL (ref 3.7–4.7)
Alkaline Phosphatase: 68 IU/L (ref 44–121)
BUN/Creatinine Ratio: 22 (ref 12–28)
BUN: 23 mg/dL (ref 8–27)
Bilirubin Total: 0.5 mg/dL (ref 0.0–1.2)
CO2: 21 mmol/L (ref 20–29)
Calcium: 9.4 mg/dL (ref 8.7–10.3)
Chloride: 104 mmol/L (ref 96–106)
Creatinine, Ser: 1.05 mg/dL — ABNORMAL HIGH (ref 0.57–1.00)
Globulin, Total: 2.6 g/dL (ref 1.5–4.5)
Glucose: 104 mg/dL — ABNORMAL HIGH (ref 65–99)
Potassium: 4.4 mmol/L (ref 3.5–5.2)
Sodium: 141 mmol/L (ref 134–144)
Total Protein: 6.8 g/dL (ref 6.0–8.5)
eGFR: 54 mL/min/{1.73_m2} — ABNORMAL LOW (ref >59)

## 2021-01-15 LAB — MICROALBUMIN / CREATININE URINE RATIO
Creatinine, Urine: 67.7 mg/dL
Microalb/Creat Ratio: 378 mg/g creat — ABNORMAL HIGH (ref 0–29)
Microalbumin, Urine: 256.2 ug/mL

## 2021-01-15 LAB — CBC WITH DIFFERENTIAL/PLATELET
Basophils Absolute: 0 10*3/uL (ref 0.0–0.2)
Basos: 0 %
EOS (ABSOLUTE): 0.2 10*3/uL (ref 0.0–0.4)
Eos: 3 %
Hematocrit: 42.6 % (ref 34.0–46.6)
Hemoglobin: 14.3 g/dL (ref 11.1–15.9)
Immature Grans (Abs): 0 10*3/uL (ref 0.0–0.1)
Immature Granulocytes: 0 %
Lymphocytes Absolute: 2.5 10*3/uL (ref 0.7–3.1)
Lymphs: 35 %
MCH: 33 pg (ref 26.6–33.0)
MCHC: 33.6 g/dL (ref 31.5–35.7)
MCV: 98 fL — ABNORMAL HIGH (ref 79–97)
Monocytes Absolute: 0.6 10*3/uL (ref 0.1–0.9)
Monocytes: 8 %
Neutrophils Absolute: 3.8 10*3/uL (ref 1.4–7.0)
Neutrophils: 54 %
Platelets: 183 10*3/uL (ref 150–450)
RBC: 4.33 x10E6/uL (ref 3.77–5.28)
RDW: 13 % (ref 11.7–15.4)
WBC: 7.2 10*3/uL (ref 3.4–10.8)

## 2021-01-15 LAB — LIPID PANEL
Chol/HDL Ratio: 4 ratio (ref 0.0–4.4)
Cholesterol, Total: 169 mg/dL (ref 100–199)
HDL: 42 mg/dL (ref >39)
LDL Chol Calc (NIH): 83 mg/dL (ref 0–99)
Triglycerides: 269 mg/dL — ABNORMAL HIGH (ref 0–149)
VLDL Cholesterol Cal: 44 mg/dL — ABNORMAL HIGH (ref 5–40)

## 2021-01-15 LAB — HEMOGLOBIN A1C
Est. average glucose Bld gHb Est-mCnc: 169 mg/dL
Hgb A1c MFr Bld: 7.5 % — ABNORMAL HIGH (ref 4.8–5.6)

## 2021-01-15 LAB — TSH: TSH: 2.2 u[IU]/mL (ref 0.450–4.500)

## 2021-04-18 ENCOUNTER — Ambulatory Visit: Payer: PPO | Admitting: Family Medicine

## 2021-04-26 ENCOUNTER — Ambulatory Visit (INDEPENDENT_AMBULATORY_CARE_PROVIDER_SITE_OTHER): Payer: PPO | Admitting: Family Medicine

## 2021-04-26 ENCOUNTER — Encounter: Payer: Self-pay | Admitting: Family Medicine

## 2021-04-26 ENCOUNTER — Other Ambulatory Visit: Payer: Self-pay

## 2021-04-26 VITALS — BP 148/70 | HR 65 | Temp 97.3°F | Wt 198.0 lb

## 2021-04-26 DIAGNOSIS — I10 Essential (primary) hypertension: Secondary | ICD-10-CM | POA: Diagnosis not present

## 2021-04-26 DIAGNOSIS — L659 Nonscarring hair loss, unspecified: Secondary | ICD-10-CM

## 2021-04-26 DIAGNOSIS — E7800 Pure hypercholesterolemia, unspecified: Secondary | ICD-10-CM | POA: Diagnosis not present

## 2021-04-26 DIAGNOSIS — E119 Type 2 diabetes mellitus without complications: Secondary | ICD-10-CM | POA: Diagnosis not present

## 2021-04-26 LAB — POCT GLYCOSYLATED HEMOGLOBIN (HGB A1C)
Est. average glucose Bld gHb Est-mCnc: 146
Hemoglobin A1C: 6.7 % — AB (ref 4.0–5.6)

## 2021-04-26 MED ORDER — ROSUVASTATIN CALCIUM 5 MG PO TABS: 5.0000 mg | ORAL_TABLET | Freq: Every day | ORAL | 3 refills | Status: DC

## 2021-04-26 NOTE — Progress Notes (Signed)
Established patient visit   Patient: Danielle Jackson   DOB: Aug 06, 1941   80 y.o. Female  MRN: [id number] Visit Date: 04/26/2021  Today's healthcare provider: [name], PA-C   Chief Complaint  Patient presents with   Diabetes   Hypertension   Subjective  -------------------------------------------------------------------------------------------------------------------- HPI  Diabetes Mellitus Type II, Follow-up  Lab Results  Component Value Date   HGBA1C 6.7 (A) 04/26/2021   HGBA1C 7.5 (H) 01/14/2021   HGBA1C 6.6 (H) 02/09/2020   Wt Readings from Last 3 Encounters:  04/26/21 198 lb (89.8 kg)  01/03/21 200 lb (90.7 kg)  01/12/20 199 lb (90.3 kg)   Last seen for diabetes 3 months ago.  Management since then includes continue same mediation. She reports good compliance with treatment. She is not having side effects.  Symptoms: No fatigue No foot ulcerations  No appetite changes No nausea  No paresthesia of the feet  No polydipsia  No polyuria No visual disturbances   No vomiting     Home blood sugar records: fasting range: 110-120  Episodes of hypoglycemia? No    Current insulin regiment: none Most Recent Eye Exam: not UTD Current exercise: walking Current diet habits: well balanced  Pertinent Labs: Lab Results  Component Value Date   CHOL 169 01/14/2021   HDL 42 01/14/2021   LDLCALC 83 01/14/2021   TRIG 269 (H) 01/14/2021   CHOLHDL 4.0 01/14/2021   Lab Results  Component Value Date   NA 141 01/14/2021   K 4.4 01/14/2021   CREATININE 1.05 (H) 01/14/2021   GFRNONAA 50 (L) 02/09/2020   GFRAA 58 (L) 02/09/2020   GLUCOSE 104 (H) 01/14/2021     ---------------------------------------------------------------------------------------------------   Hypertension, follow-up  BP Readings from Last 3 Encounters:  04/26/21 (!) 148/70  01/03/21 (!) 149/57  01/12/20 132/80   Wt Readings from Last 3 Encounters:  04/26/21 198 lb (89.8 kg)  01/03/21  200 lb (90.7 kg)  01/12/20 199 lb (90.3 kg)     She was last seen for hypertension 3 months ago.  BP at that visit was 149/57. Management since that visit includes advising patient work on weight loss and follow diabetic low sodium diet..  She reports good compliance with treatment. She is not having side effects.  She is following a Regular diet. She is exercising. She does not smoke.  Use of agents associated with hypertension: none.   Outside blood pressures are averaging 110/70. Symptoms: No chest pain No chest pressure  No palpitations No syncope  No dyspnea No orthopnea  No paroxysmal nocturnal dyspnea No lower extremity edema   Pertinent labs: Lab Results  Component Value Date   CHOL 169 01/14/2021   HDL 42 01/14/2021   LDLCALC 83 01/14/2021   TRIG 269 (H) 01/14/2021   CHOLHDL 4.0 01/14/2021   Lab Results  Component Value Date   NA 141 01/14/2021   K 4.4 01/14/2021   CREATININE 1.05 (H) 01/14/2021   GFRNONAA 50 (L) 02/09/2020   GFRAA 58 (L) 02/09/2020   GLUCOSE 104 (H) 01/14/2021     The 10-year ASCVD risk score Denman George DC Jr., et al., 2013) is: 60.6%   ---------------------------------------------------------------------------------------------------   Past Medical History:  Diagnosis Date   Diabetes mellitus without complication (HCC)    Hyperlipidemia    Hypertension    Past Surgical History:  Procedure Laterality Date   ABDOMINAL HYSTERECTOMY  1975   CATARACT EXTRACTION  03/01/2001   KNEE SURGERY  03/24/2014   Social History  Tobacco Use   Smoking status: Never   Smokeless tobacco: Never  Substance Use Topics   Alcohol use: No    Alcohol/week: 0.0 standard drinks   Drug use: No   Family Status  Relation Name Status   Mother  Deceased   Father  Deceased   Sister  Alive   Brother  Alive   Neg Hx  (Not Specified)   Allergies  Allergen Reactions   Amoxicillin Diarrhea   Cefprozil    Hydrochlorothiazide Cough   Lovastatin     Other  reaction(s): Muscle Cramps   Medrol  [Methylprednisolone]    Metformin Hcl Diarrhea    Severe Diarrhea   Metronidazole     diarrhea   Nitrofurantoin Monohyd Macro     Other reaction(s): Wheezing   Pravastatin Sodium     Other reaction(s): Muscle Pain   Tetracycline Hives   Clarithromycin Rash       Medications: Outpatient Medications Prior to Visit  Medication Sig   atorvastatin (LIPITOR) 10 MG tablet TAKE 1 TABLET BY MOUTH EVERY NIGHT AT BEDTIME   CALCIUM CARBONATE-VIT D-MIN PO Take by mouth 2 (two) times daily.   Cholecalciferol (VITAMIN D) 2000 UNITS tablet Take by mouth daily.   fluticasone (FLONASE) 50 MCG/ACT nasal spray Place into the nose daily.   glipiZIDE (GLUCOTROL XL) 5 MG 24 hr tablet TAKE 1 TABLET BY MOUTH ONCE A DAY   ibuprofen (ADVIL,MOTRIN) 200 MG tablet Take by mouth as needed.   Loratadine 10 MG CAPS Take by mouth daily. As needed   losartan (COZAAR) 50 MG tablet Take 1 tablet (50 mg total) by mouth daily.   ONETOUCH ULTRA test strip USE ONE STRIP TO CHECK GLUCOSE ONCE DAILY   Probiotic Product (FORTIFY DAILY PROBIOTIC PO) Take by mouth.   No facility-administered medications prior to visit.    Review of Systems  Constitutional:  Negative for appetite change, chills, fatigue and fever.  Respiratory:  Negative for chest tightness and shortness of breath.   Cardiovascular:  Negative for chest pain and palpitations.  Gastrointestinal:  Negative for abdominal pain, nausea and vomiting.  Neurological:  Negative for dizziness and weakness.      Objective  -------------------------------------------------------------------------------------------------------------------- BP (!) 148/70 (BP Location: Right Arm, Patient Position: Sitting, Cuff Size: Normal)   Pulse 65   Temp (!) 97.3 F (36.3 C) (Temporal)   Wt 198 lb (89.8 kg)   SpO2 100% Comment: room air  BMI 33.99 kg/m  BP Readings from Last 3 Encounters:  04/26/21 (!) 148/70  01/03/21 (!) 149/57   01/12/20 132/80   Wt Readings from Last 3 Encounters:  04/26/21 198 lb (89.8 kg)  01/03/21 200 lb (90.7 kg)  01/12/20 199 lb (90.3 kg)     Today's Vitals   04/26/21 1104 04/26/21 1113  BP: (!) 150/66 (!) 148/70  Pulse: 65   Temp: (!) 97.3 F (36.3 C)   TempSrc: Temporal   SpO2: 100%   Weight: 198 lb (89.8 kg)    Body mass index is 33.99 kg/m.     Physical Exam Constitutional:      General: She is not in acute distress.    Appearance: She is well-developed.  HENT:     Head: Normocephalic and atraumatic.     Right Ear: Hearing normal.     Left Ear: Hearing normal.     Nose: Nose normal.  Eyes:     General: Lids are normal. No scleral icterus.       Right eye:  No discharge.        Left eye: No discharge.     Conjunctiva/sclera: Conjunctivae normal.  Cardiovascular:     Rate and Rhythm: Normal rate and regular rhythm.     Heart sounds: Normal heart sounds.  Pulmonary:     Effort: Pulmonary effort is normal. No respiratory distress.     Breath sounds: Normal breath sounds.  Abdominal:     General: Bowel sounds are normal.     Palpations: Abdomen is soft.  Musculoskeletal:        General: Normal range of motion.     Cervical back: Neck supple.  Skin:    Findings: No lesion or rash.  Neurological:     Mental Status: She is alert and oriented to person, place, and time.  Psychiatric:        Speech: Speech normal.        Behavior: Behavior normal.        Thought Content: Thought content normal.      Results for orders placed or performed in visit on 04/26/21  POCT HgB A1C  Result Value Ref Range   Hemoglobin A1C 6.7 (A) 4.0 - 5.6 %   Est. average glucose Bld gHb Est-mCnc 146     Assessment & Plan  ---------------------------------------------------------------------------------------------------------------------- 1. Controlled type 2 diabetes mellitus without complication, without long-term current use of insulin (HCC) Hgb A1C 6.7 today. Feeling well and  tolerating the Glipizide 5 mg qd. No polyuria or polydipsia. Recheck labs and follow up pending report. - POCT HgB A1C - CBC with Differential/Platelet - Comprehensive metabolic panel - Lipid panel  2. Benign essential HTN BP in fair control with some elevation of systolic pressure. Continue Losartan 50 mg qd and restrict salt intake. Check follow up labs. - CBC with Differential/Platelet - Comprehensive metabolic panel - Lipid panel - TSH  3. Hypercholesteremia Feels she may be having thinning of her hair more than usual. Wants to switch Lipitor to Crestor to see if it is a side effect of the medication. Recheck labs. - rosuvastatin (CRESTOR) 5 MG tablet; Take 1 tablet (5 mg total) by mouth daily.  Dispense: 30 tablet; Refill: 3 - Comprehensive metabolic panel - Lipid panel - TSH  4. Hair loss Only family history of hair thinning is a sister, but, her hair loss is not to this degree. Wants to try switch of her statin to see if it is a side effect of the lipitor. Check labs for metabolic disorder. - CBC with Differential/Platelet - Comprehensive metabolic panel - TSH   No follow-ups on file.      I,  , PA-C, have reviewed all documentation for this visit. The documentation on 04/26/21 for the exam, diagnosis, procedures, and orders are all accurate and complete.    [name], PA-C  [facility] [phone] (phone) [phone] (fax)  [facility]

## 2021-05-01 DIAGNOSIS — L659 Nonscarring hair loss, unspecified: Secondary | ICD-10-CM | POA: Diagnosis not present

## 2021-05-01 DIAGNOSIS — E7800 Pure hypercholesterolemia, unspecified: Secondary | ICD-10-CM | POA: Diagnosis not present

## 2021-05-01 DIAGNOSIS — I10 Essential (primary) hypertension: Secondary | ICD-10-CM | POA: Diagnosis not present

## 2021-05-01 DIAGNOSIS — E119 Type 2 diabetes mellitus without complications: Secondary | ICD-10-CM | POA: Diagnosis not present

## 2021-05-02 LAB — COMPREHENSIVE METABOLIC PANEL
ALT: 16 IU/L (ref 0–32)
AST: 13 IU/L (ref 0–40)
Albumin/Globulin Ratio: 1.6 (ref 1.2–2.2)
Albumin: 4.1 g/dL (ref 3.7–4.7)
Alkaline Phosphatase: 75 IU/L (ref 44–121)
BUN/Creatinine Ratio: 21 (ref 12–28)
BUN: 20 mg/dL (ref 8–27)
Bilirubin Total: 0.4 mg/dL (ref 0.0–1.2)
CO2: 20 mmol/L (ref 20–29)
Calcium: 9.1 mg/dL (ref 8.7–10.3)
Chloride: 107 mmol/L — ABNORMAL HIGH (ref 96–106)
Creatinine, Ser: 0.95 mg/dL (ref 0.57–1.00)
Globulin, Total: 2.5 g/dL (ref 1.5–4.5)
Glucose: 137 mg/dL — ABNORMAL HIGH (ref 65–99)
Potassium: 4.1 mmol/L (ref 3.5–5.2)
Sodium: 142 mmol/L (ref 134–144)
Total Protein: 6.6 g/dL (ref 6.0–8.5)
eGFR: 61 mL/min/{1.73_m2} (ref >59)

## 2021-05-02 LAB — LIPID PANEL
Chol/HDL Ratio: 4.5 ratio — ABNORMAL HIGH (ref 0.0–4.4)
Cholesterol, Total: 170 mg/dL (ref 100–199)
HDL: 38 mg/dL — ABNORMAL LOW (ref >39)
LDL Chol Calc (NIH): 83 mg/dL (ref 0–99)
Triglycerides: 300 mg/dL — ABNORMAL HIGH (ref 0–149)
VLDL Cholesterol Cal: 49 mg/dL — ABNORMAL HIGH (ref 5–40)

## 2021-05-02 LAB — CBC WITH DIFFERENTIAL/PLATELET
Basophils Absolute: 0 10*3/uL (ref 0.0–0.2)
Basos: 0 %
EOS (ABSOLUTE): 0.2 10*3/uL (ref 0.0–0.4)
Eos: 4 %
Hematocrit: 39 % (ref 34.0–46.6)
Hemoglobin: 13.5 g/dL (ref 11.1–15.9)
Immature Grans (Abs): 0 10*3/uL (ref 0.0–0.1)
Immature Granulocytes: 1 %
Lymphocytes Absolute: 1.8 10*3/uL (ref 0.7–3.1)
Lymphs: 32 %
MCH: 33.4 pg — ABNORMAL HIGH (ref 26.6–33.0)
MCHC: 34.6 g/dL (ref 31.5–35.7)
MCV: 97 fL (ref 79–97)
Monocytes Absolute: 0.6 10*3/uL (ref 0.1–0.9)
Monocytes: 10 %
Neutrophils Absolute: 3.2 10*3/uL (ref 1.4–7.0)
Neutrophils: 53 %
Platelets: 181 10*3/uL (ref 150–450)
RBC: 4.04 x10E6/uL (ref 3.77–5.28)
RDW: 13.2 % (ref 11.7–15.4)
WBC: 5.8 10*3/uL (ref 3.4–10.8)

## 2021-05-02 LAB — TSH: TSH: 2.89 u[IU]/mL (ref 0.450–4.500)

## 2021-05-09 ENCOUNTER — Telehealth: Payer: Self-pay | Admitting: Family Medicine

## 2021-05-09 NOTE — Telephone Encounter (Unsigned)
Copied from CRM [id number]. Topic: General - Other >> May 09, 2021  1:09 PM Gwenlyn Fudge wrote: Reason for CRM: Pt calling to speak with PCP nurse. She states that she started the new medication, rosuvastatin, for a week. She states that this is not helping her as well as the atorvastatin and is requesting to be put back on it.  Pt states that she is not sure when she should schedule her next appt. Please advise.    [address] - [place], [place] - [address] [place] [address] Phone: [phone] Fax: [phone] Hours: Not open 24 hours

## 2021-05-09 NOTE — Telephone Encounter (Signed)
Can switch back to the Atorvastatin 80 mg qd #90 & 3 RF if she prefers. Have not been on the Rosuvastatin 10 mg long enough to see if it can help the hyperlipidemia. Either way, should schedule recheck in 3 months to assess progress. Schedule with new PCP here.

## 2021-05-10 ENCOUNTER — Other Ambulatory Visit: Payer: Self-pay

## 2021-05-10 DIAGNOSIS — E7800 Pure hypercholesterolemia, unspecified: Secondary | ICD-10-CM

## 2021-05-10 MED ORDER — ATORVASTATIN CALCIUM 80 MG PO TABS: 80.0000 mg | ORAL_TABLET | Freq: Every day | ORAL | 3 refills | Status: DC

## 2021-05-17 DIAGNOSIS — M1711 Unilateral primary osteoarthritis, right knee: Secondary | ICD-10-CM | POA: Diagnosis not present

## 2021-05-28 ENCOUNTER — Ambulatory Visit: Payer: Self-pay | Admitting: Family Medicine

## 2021-05-28 NOTE — Telephone Encounter (Signed)
Pt asking if she could start at a lower dose of Atorvastatin instead of 80mg . States she would like to "Have this pain gone before I start that high dose. " States she was on 10mg  of atorvastatin  before starting the rosuvastatin, which was D/Ced 05/10/21 due to joint pain and swelling. She would like to start with the 10mg  of atorvastatin she has left and gradually increase if PCP thinks appropriate. States she has been working on diet modifications "I wasn't eating too good for a while when I had that blood work done.' Assured pt Nt would route to practice for PCPs review. Please advise: [phone] Reason for Disposition  [1] Caller has NON-URGENT medicine question about med that PCP prescribed AND [2] triager unable to answer question  Answer Assessment - Initial Assessment Questions 1. NAME of MEDICATION: "What medicine are you calling about?"     Atorvastatin 2. QUESTION: "What is your question?" (e.g., double dose of medicine, side effect)     *No Answer* 3. PRESCRIBING HCP: "Who prescribed it?" Reason: if prescribed by specialist, call should be referred to that group.     *No Answer* 4. SYMPTOMS: "Do you have any symptoms?"     *No Answer* 5. SEVERITY: If symptoms are present, ask "Are they mild, moderate or severe?"     *No Answer* 6. PREGNANCY:  "Is there any chance that you are pregnant?" "When was your last menstrual period?"     *No Answer*  Protocols used: Medication Question Call-A-AH

## 2021-05-28 NOTE — Telephone Encounter (Signed)
Pt calling stating that she was given a prescription for rosuvastatin which caused her to have a reaction, bad joints/ swelling. She states that she stopped taking the medication on 05/10/21 and that her symptoms are still showing. She states that she did receive a replacement prescription for atorvastatin at a higher dose and is concerned that it may start to give her a reaction as well. She is requesting to have advice on if she would be able to take half pills instead of the full. Please advise.   Called patient to review symptoms . No answer, left message to call clinic back.

## 2021-05-30 NOTE — Telephone Encounter (Signed)
Spoke with patient on the phone she states that she is only taking Atorvastatin. She states that she stopped Lipitor a while ago due to side effects such as headache, joint pain and abdominal pain. KW

## 2021-05-30 NOTE — Telephone Encounter (Signed)
Patient was last seen by Maurine Minister on 04/23/21 and stated the following Feels she may be having thinning of her hair more than usual. Wants to switch Lipitor to Crestor to see if it is a side effect of the medication. Recheck labs. - rosuvastatin (CRESTOR) 5 MG tablet; Take 1 tablet (5 mg total) by mouth daily.  Dispense: 30 tablet; Refill: 3  Please see nurse triage note below, patient would like to change prescription again please review. KW

## 2021-05-31 NOTE — Telephone Encounter (Signed)
Spoke with patient again to clarify she states that she was on Crestor and states when she had labs they were elevated and she states that she feels Crestor gave her the "bad results' She states that Maurine Minister had suggested she be on Atorvastatin 80mg  but patient had concerns because of side effects such as hair loss and feeling tired. Patient states that she feels 80mg  is to much and is wanting to be on a lower dose such as 10mg . Please advise. KW

## 2021-06-03 ENCOUNTER — Other Ambulatory Visit: Payer: Self-pay | Admitting: *Deleted

## 2021-06-03 DIAGNOSIS — E7800 Pure hypercholesterolemia, unspecified: Secondary | ICD-10-CM

## 2021-06-03 NOTE — Telephone Encounter (Signed)
Patient was advised. Patient will come by and get lipid panel drawn this week. Lab ordered.

## 2021-06-03 NOTE — Progress Notes (Unsigned)
Patient was advised. Patient will come by office this week and get lipid panel drawn. Lab ordered.

## 2021-06-04 DIAGNOSIS — E7800 Pure hypercholesterolemia, unspecified: Secondary | ICD-10-CM | POA: Diagnosis not present

## 2021-06-05 ENCOUNTER — Telehealth: Payer: Self-pay | Admitting: Family Medicine

## 2021-06-05 LAB — LIPID PANEL
Chol/HDL Ratio: 5.1 ratio — ABNORMAL HIGH (ref 0.0–4.4)
Cholesterol, Total: 202 mg/dL — ABNORMAL HIGH (ref 100–199)
HDL: 40 mg/dL (ref >39)
LDL Chol Calc (NIH): 123 mg/dL — ABNORMAL HIGH (ref 0–99)
Triglycerides: 218 mg/dL — ABNORMAL HIGH (ref 0–149)
VLDL Cholesterol Cal: 39 mg/dL (ref 5–40)

## 2021-06-05 NOTE — Telephone Encounter (Signed)
Patient had a bad reaction from the generic crestor and the atorvastatin which caused hair loss. Patient would like to start off on a lower dose and work her way up. Patient was able to view most recent lab results but would like to discuss which medication will be sent to her local  pharmacy.

## 2021-06-06 ENCOUNTER — Other Ambulatory Visit: Payer: Self-pay | Admitting: Family Medicine

## 2021-06-06 NOTE — Telephone Encounter (Signed)
Patient advised.KW 

## 2021-06-06 NOTE — Telephone Encounter (Signed)
Patient advised and states that crestor she wants marked in her chart and not tolerable to take. She stated that she has still at home Atorvastatin 10mg  patient wants to know if she can start back on it? She does not want to start back on 80mg  dosage previously prescribed. KW

## 2021-07-01 DIAGNOSIS — H524 Presbyopia: Secondary | ICD-10-CM | POA: Diagnosis not present

## 2021-07-01 DIAGNOSIS — H18463 Peripheral corneal degeneration, bilateral: Secondary | ICD-10-CM | POA: Diagnosis not present

## 2021-07-01 DIAGNOSIS — E119 Type 2 diabetes mellitus without complications: Secondary | ICD-10-CM | POA: Diagnosis not present

## 2021-07-01 LAB — HM DIABETES EYE EXAM

## 2021-07-31 ENCOUNTER — Other Ambulatory Visit: Payer: Self-pay

## 2021-07-31 ENCOUNTER — Ambulatory Visit (INDEPENDENT_AMBULATORY_CARE_PROVIDER_SITE_OTHER): Payer: PPO | Admitting: Family Medicine

## 2021-07-31 ENCOUNTER — Encounter: Payer: Self-pay | Admitting: Family Medicine

## 2021-07-31 VITALS — BP 110/70 | HR 100 | Resp 16 | Wt 192.9 lb

## 2021-07-31 DIAGNOSIS — I10 Essential (primary) hypertension: Secondary | ICD-10-CM

## 2021-07-31 DIAGNOSIS — E7800 Pure hypercholesterolemia, unspecified: Secondary | ICD-10-CM | POA: Diagnosis not present

## 2021-07-31 DIAGNOSIS — E119 Type 2 diabetes mellitus without complications: Secondary | ICD-10-CM

## 2021-07-31 DIAGNOSIS — L659 Nonscarring hair loss, unspecified: Secondary | ICD-10-CM | POA: Diagnosis not present

## 2021-07-31 LAB — POCT GLYCOSYLATED HEMOGLOBIN (HGB A1C): Hemoglobin A1C: 6.3 % — AB (ref 4.0–5.6)

## 2021-07-31 MED ORDER — ATORVASTATIN CALCIUM 10 MG PO TABS: 10.0000 mg | ORAL_TABLET | Freq: Every day | ORAL | 3 refills | Status: DC

## 2021-07-31 NOTE — Assessment & Plan Note (Signed)
Pt reports loss of hair d/t statin use reiterated with patient how if this is a SE of the medication is is rare and to continue to monitor

## 2021-07-31 NOTE — Progress Notes (Signed)
Established patient visit   Patient: Danielle Jackson   DOB: 1940-11-09   80 y.o. Female  MRN: [id number] Visit Date: 07/31/2021  Today's healthcare provider: Gwyneth Sprout, FNP   Chief Complaint  Patient presents with   Diabetes   Hypertension   Hyperlipidemia   Subjective    HPI  Diabetes Mellitus Type II, follow-up  Lab Results  Component Value Date   HGBA1C 6.7 (A) 04/26/2021   HGBA1C 7.5 (H) 01/14/2021   HGBA1C 6.6 (H) 02/09/2020   Last seen for diabetes 3 months ago.  Management since then includes continuing the same treatment. She reports excellent compliance with treatment. She is not having side effects.   Home blood sugar records: fasting range: 120-130  Episodes of hypoglycemia? No ; lowest value seen was    Current insulin regiment: none Most Recent Eye Exam: 07/01/21  --------------------------------------------------------------------------------------------------- Hypertension, follow-up  BP Readings from Last 3 Encounters:  07/31/21 110/70  04/26/21 (!) 148/70  01/03/21 (!) 149/57   Wt Readings from Last 3 Encounters:  07/31/21 192 lb 14.4 oz (87.5 kg)  04/26/21 198 lb (89.8 kg)  01/03/21 200 lb (90.7 kg)     She was last seen for hypertension 3 months ago.  BP at that visit was 148/70. Management since that visit includes none continue Losartan. She reports excellent compliance with treatment. She is not having side effects.  She is not exercising. She is adherent to low salt diet.   Outside blood pressures are 110/70s She does not smoke.  Use of agents associated with hypertension: none.   --------------------------------------------------------------------------------------------------- Lipid/Cholesterol, follow-up  Last Lipid Panel: Lab Results  Component Value Date   CHOL 202 (H) 06/04/2021   LDLCALC 123 (H) 06/04/2021   HDL 40 06/04/2021   TRIG 218 (H) 06/04/2021    She was last seen for this 3 months ago.   Management since that visit includes none.  She reports excellent compliance with treatment. She is not having side effects.   Symptoms: No appetite changes No foot ulcerations  No chest pain No chest pressure/discomfort  No dyspnea No orthopnea  No fatigue No lower extremity edema  No palpitations No paroxysmal nocturnal dyspnea  No nausea No numbness or tingling of extremity  No polydipsia No polyuria  No speech difficulty No syncope   She is following a Regular diet. Current exercise: no regular exercise  Last metabolic panel Lab Results  Component Value Date   GLUCOSE 137 (H) 05/01/2021   NA 142 05/01/2021   K 4.1 05/01/2021   BUN 20 05/01/2021   CREATININE 0.95 05/01/2021   EGFR 61 05/01/2021   GFRNONAA 50 (L) 02/09/2020   CALCIUM 9.1 05/01/2021   AST 13 05/01/2021   ALT 16 05/01/2021   The 10-year ASCVD risk score (Arnett DK, et al., 2019) is: 39.4%  ---------------------------------------------------------------------------------------------------   Medications: Outpatient Medications Prior to Visit  Medication Sig   CALCIUM CARBONATE-VIT D-MIN PO Take by mouth 2 (two) times daily.   Cholecalciferol (VITAMIN D) 2000 UNITS tablet Take by mouth daily.   fluticasone (FLONASE) 50 MCG/ACT nasal spray Place into the nose daily.   glipiZIDE (GLUCOTROL XL) 5 MG 24 hr tablet TAKE 1 TABLET BY MOUTH ONCE A DAY   ibuprofen (ADVIL,MOTRIN) 200 MG tablet Take by mouth as needed.   Loratadine 10 MG CAPS Take by mouth daily. As needed   losartan (COZAAR) 50 MG tablet Take 1 tablet (50 mg total) by mouth daily.   ONETOUCH  ULTRA test strip USE ONE STRIP TO CHECK GLUCOSE ONCE DAILY   Probiotic Product (FORTIFY DAILY PROBIOTIC PO) Take by mouth.   No facility-administered medications prior to visit.    Review of Systems     Objective    BP 110/70 Comment: home reading  Pulse 100   Resp 16   Wt 192 lb 14.4 oz (87.5 kg)   SpO2 100%   BMI 33.11 kg/m    Physical  Exam Vitals and nursing note reviewed.  Constitutional:      General: She is not in acute distress.    Appearance: Normal appearance. She is obese. She is not ill-appearing, toxic-appearing or diaphoretic.  HENT:     Head: Normocephalic and atraumatic.  Cardiovascular:     Rate and Rhythm: Regular rhythm. Tachycardia present.     Pulses: Normal pulses.     Heart sounds: Normal heart sounds. No murmur heard.   No friction rub. No gallop.  Pulmonary:     Effort: Pulmonary effort is normal. No respiratory distress.     Breath sounds: Normal breath sounds. No stridor. No wheezing, rhonchi or rales.  Chest:     Chest wall: No tenderness.  Abdominal:     General: Bowel sounds are normal.     Palpations: Abdomen is soft.  Musculoskeletal:        General: No swelling, tenderness, deformity or signs of injury. Normal range of motion.     Right lower leg: No edema.     Left lower leg: No edema.  Skin:    General: Skin is warm and dry.     Capillary Refill: Capillary refill takes less than 2 seconds.     Coloration: Skin is not jaundiced or pale.     Findings: No bruising, erythema, lesion or rash.  Neurological:     General: No focal deficit present.     Mental Status: She is alert and oriented to person, place, and time. Mental status is at baseline.     Cranial Nerves: No cranial nerve deficit.     Sensory: No sensory deficit.     Motor: No weakness.     Coordination: Coordination normal.  Psychiatric:        Mood and Affect: Mood normal.        Behavior: Behavior normal.        Thought Content: Thought content normal.        Judgment: Judgment normal.      No results found for any visits on 07/31/21.  Assessment & Plan     Problem List Items Addressed This Visit       Cardiovascular and Mediastinum   Benign essential HTN    Chronic, stable Worse in office than at home Denies any lows/highs      Relevant Medications   atorvastatin (LIPITOR) 10 MG tablet      Endocrine   Controlled diabetes mellitus type II without complication (HCC) - Primary   Relevant Medications   atorvastatin (LIPITOR) 10 MG tablet   Other Relevant Orders   POCT glycosylated hemoglobin (Hb A1C)     Other   Hypercholesteremia    We recommend diet low in saturated fat and regular exercise - 30 min at least 5 times per week       Relevant Medications   atorvastatin (LIPITOR) 10 MG tablet   Hair loss    Pt reports loss of hair d/t statin use reiterated with patient how if this is a SE of the  medication is is rare and to continue to monitor        Return in about 3 months (around 10/29/2021) for HTN management, T2DM management.      Vonna Kotyk, FNP, have reviewed all documentation for this visit. The documentation on 07/31/21 for the exam, diagnosis, procedures, and orders are all accurate and complete.    Gwyneth Sprout, [place] [phone] (phone) [phone] (fax)  [place]

## 2021-07-31 NOTE — Assessment & Plan Note (Signed)
Chronic, stable Worse in office than at home Denies any lows/highs

## 2021-07-31 NOTE — Assessment & Plan Note (Signed)
We recommend diet low in saturated fat and regular exercise - 30 min at least 5 times per week  

## 2021-10-02 ENCOUNTER — Telehealth: Payer: Self-pay | Admitting: Family Medicine

## 2021-10-02 DIAGNOSIS — E119 Type 2 diabetes mellitus without complications: Secondary | ICD-10-CM

## 2021-10-02 MED ORDER — ONETOUCH ULTRA VI STRP: ORAL_STRIP | 3 refills | Status: DC

## 2021-10-02 NOTE — Telephone Encounter (Signed)
[facility] faxed refill request for the following medications:  ONETOUCH ULTRA test strip   Please advise.

## 2021-10-04 NOTE — Telephone Encounter (Signed)
Prescription sent in on 10/02/21 with confirmation receipt from pharmacy. KW

## 2021-10-09 ENCOUNTER — Ambulatory Visit (INDEPENDENT_AMBULATORY_CARE_PROVIDER_SITE_OTHER): Payer: PPO

## 2021-10-09 DIAGNOSIS — Z0000 Encounter for general adult medical examination without abnormal findings: Secondary | ICD-10-CM | POA: Diagnosis not present

## 2021-10-09 NOTE — Progress Notes (Signed)
Virtual Visit via Telephone Note  I connected with  [name] on 10/09/21 at  3:00 PM EST by telephone and verified that I am speaking with the correct person using two identifiers.  Location: Patient: home Provider: [facility] Persons participating in the virtual visit: patient/Nurse Health Advisor   I discussed the limitations, risks, security and privacy concerns of performing an evaluation and management service by telephone and the availability of in person appointments. The patient expressed understanding and agreed to proceed.  Interactive audio and video telecommunications were attempted between this nurse and patient, however failed, due to patient having technical difficulties OR patient did not have access to video capability.  We continued and completed visit with audio only.  Some vital signs may be absent or patient reported.   Hal Hope, LPN  Subjective:   Danielle Jackson is a 81 y.o. female who presents for Medicare Annual (Subsequent) preventive examination.  Review of Systems           Objective:    There were no vitals filed for this visit. There is no height or weight on file to calculate BMI.  Advanced Directives 01/23/2020 07/25/2019 09/03/2018 03/07/2015  Does Patient Have a Medical Advance Directive? No No No Yes  Type of Advance Directive - - - Healthcare Power of Attorney  Does patient want to make changes to medical advance directive? - - - No - Patient declined  Copy of Healthcare Power of Attorney in Chart? No - copy requested - - -  Would patient like information on creating a medical advance directive? - No - Patient declined No - Patient declined -    Current Medications (verified) Outpatient Encounter Medications as of 10/09/2021  Medication Sig   atorvastatin (LIPITOR) 10 MG tablet Take 1 tablet (10 mg total) by mouth daily.   CALCIUM CARBONATE-VIT D-MIN PO Take by mouth 2 (two) times daily.   Cholecalciferol (VITAMIN D) 2000 UNITS tablet  Take by mouth daily.   FLUAD QUADRIVALENT 0.5 ML injection    fluticasone (FLONASE) 50 MCG/ACT nasal spray Place into the nose daily.   glipiZIDE (GLUCOTROL XL) 5 MG 24 hr tablet TAKE 1 TABLET BY MOUTH ONCE A DAY   glucose blood (ONETOUCH ULTRA) test strip USE ONE STRIP TO CHECK GLUCOSE ONCE DAILY   ibuprofen (ADVIL,MOTRIN) 200 MG tablet Take by mouth as needed.   Loratadine 10 MG CAPS Take by mouth daily. As needed   losartan (COZAAR) 50 MG tablet Take 1 tablet (50 mg total) by mouth daily.   Probiotic Product (FORTIFY DAILY PROBIOTIC PO) Take by mouth.   No facility-administered encounter medications on file as of 10/09/2021.    Allergies (verified) Amoxicillin, Cefprozil, Hydrochlorothiazide, Lovastatin, Medrol  [methylprednisolone], Metformin hcl, Metronidazole, Nitrofurantoin monohyd macro, Pravastatin sodium, Tetracycline, and Clarithromycin   History: Past Medical History:  Diagnosis Date   Diabetes mellitus without complication (HCC)    Hyperlipidemia    Hypertension    Past Surgical History:  Procedure Laterality Date   ABDOMINAL HYSTERECTOMY  1975   CATARACT EXTRACTION  03/01/2001   KNEE SURGERY  03/24/2014   Family History  Problem Relation Age of Onset   Diabetes Mother    Heart disease Mother    Hyperlipidemia Mother    Hypertension Mother    Arthritis Mother    Mental illness Mother    Asthma Father    Cancer Father    Hypertension Sister    Asthma Brother    Breast cancer Neg Hx  Social History   Socioeconomic History   Marital status: Married    Spouse name: Not on file   Number of children: 2   Years of education: Not on file   Highest education level: High school graduate  Occupational History   Not on file  Tobacco Use   Smoking status: Never   Smokeless tobacco: Never  Substance and Sexual Activity   Alcohol use: No    Alcohol/week: 0.0 standard drinks   Drug use: No   Sexual activity: Not on file  Other Topics Concern   Not on file   Social History Narrative   Not on file   Social Determinants of Health   Financial Resource Strain: Not on file  Food Insecurity: Not on file  Transportation Needs: Not on file  Physical Activity: Not on file  Stress: Not on file  Social Connections: Not on file    Tobacco Counseling Counseling given: Not Answered   Clinical Intake:  Pre-visit preparation completed: Yes  Pain : No/denies pain     Nutritional Risks: None Diabetes: Yes CBG done?: No Did pt. bring in CBG monitor from home?: No  How often do you need to have someone help you when you read instructions, pamphlets, or other written materials from your doctor or pharmacy?: 1 - Never  Diabetic?yes Nutrition Risk Assessment:  Has the patient had any N/V/D within the last 2 months?  Yes  Does the patient have any non-healing wounds?  No  Has the patient had any unintentional weight loss or weight gain?  No   Diabetes:  Is the patient diabetic?  Yes  If diabetic, was a CBG obtained today?  No  Did the patient bring in their glucometer from home?  No  How often do you monitor your CBG's? Every morning.   Financial Strains and Diabetes Management:  Are you having any financial strains with the device, your supplies or your medication? No .  Does the patient want to be seen by Chronic Care Management for management of their diabetes?  No  Would the patient like to be referred to a Nutritionist or for Diabetic Management?  No   Diabetic Exams:  Diabetic Eye Exam: Completed 07/01/21. Pt has been advised about the importance in completing this exam.   Diabetic Foot Exam: Completed 01/03/21. Pt has been advised about the importance in completing this exam.  Interpreter Needed?: No  Information entered by :: Kennedy BuckerLorrie , LPN   Activities of Daily Living In your present state of health, do you have any difficulty performing the following activities: 01/03/2021  Hearing? N  Vision? N  Difficulty  concentrating or making decisions? N  Walking or climbing stairs? Y  Dressing or bathing? N  Doing errands, shopping? N  Some recent data might be hidden    Patient Care Team: Jacky KindlePayne, Elise T, FNP as PCP - General (Family Medicine) Thurnell GarbeWeoloski, Mark, OD (Optometry)  Indicate any recent Medical Services you may have received from other than [facility] providers in the past year (date may be approximate).     Assessment:   This is a routine wellness examination for Steward DroneBrenda.  Hearing/Vision screen No results found.  Dietary issues and exercise activities discussed:     Goals Addressed   None    Depression Screen PHQ 2/9 Scores 04/26/2021 01/03/2021 01/12/2020 06/06/2019 09/06/2018 09/03/2018 08/10/2018  PHQ - 2 Score 0 0 0 0 0 0 0  PHQ- 9 Score 0 0 0 - - - -  Fall Risk Fall Risk  01/03/2021 01/23/2020 01/12/2020 06/06/2019 09/06/2018  Falls in the past year? 0 0 0 0 0  Number falls in past yr: 0 0 - 0 -  Injury with Fall? 0 0 - 1 -  Risk for fall due to : No Fall Risks - - - -  Follow up Falls evaluation completed - - - -    FALL RISK PREVENTION PERTAINING TO THE HOME:  Any stairs in or around the home? Yes  If so, are there any without handrails? No  Home free of loose throw rugs in walkways, pet beds, electrical cords, etc? Yes  Adequate lighting in your home to reduce risk of falls? Yes   ASSISTIVE DEVICES UTILIZED TO PREVENT FALLS:  Life alert? No  Use of a cane, walker or w/c? No  Grab bars in the bathroom? Yes  Shower chair or bench in shower? Yes  Elevated toilet seat or a handicapped toilet? Yes    Cognitive Function:Normal cognitive status assessed by direct observation by this Nurse Health Advisor. No abnormalities found.          Immunizations Immunization History  Administered Date(s) Administered   Influenza Split 06/25/2010, 05/20/2011   Influenza, High Dose Seasonal PF 06/06/2014, 06/08/2015, 06/03/2016, 06/24/2017, 07/02/2019   Influenza,inj,Quad PF,6+ Mos  05/03/2013   Influenza-Unspecified 06/01/2014, 06/03/2016, 07/01/2018   Pneumococcal Conjugate-13 06/06/2014   Pneumococcal Polysaccharide-23 07/03/2007   Td 08/02/2004   Tdap 05/20/2011   Zoster, Live 01/04/2013    TDAP status: Due, Education has been provided regarding the importance of this vaccine. Advised may receive this vaccine at local pharmacy or Health Dept. Aware to provide a copy of the vaccination record if obtained from local pharmacy or Health Dept. Verbalized acceptance and understanding.  Flu Vaccine status: Up to date  Pneumococcal vaccine status: Up to date  Covid-19 vaccine status: Declined, Education has been provided regarding the importance of this vaccine but patient still declined. Advised may receive this vaccine at local pharmacy or Health Dept.or vaccine clinic. Aware to provide a copy of the vaccination record if obtained from local pharmacy or Health Dept. Verbalized acceptance and understanding.  Qualifies for Shingles Vaccine? Yes   Zostavax completed Yes   Shingrix Completed?: No.    Education has been provided regarding the importance of this vaccine. Patient has been advised to call insurance company to determine out of pocket expense if they have not yet received this vaccine. Advised may also receive vaccine at local pharmacy or Health Dept. Verbalized acceptance and understanding.  Screening Tests Health Maintenance  Topic Date Due   COVID-19 Vaccine (1) Never done   Hepatitis C Screening  Never done   Zoster Vaccines- Shingrix (1 of 2) Never done   INFLUENZA VACCINE  04/01/2021   TETANUS/TDAP  05/19/2021   FOOT EXAM  01/03/2022   HEMOGLOBIN A1C  01/28/2022   DEXA SCAN  06/15/2022   OPHTHALMOLOGY EXAM  07/01/2022   Pneumonia Vaccine 60+ Years old  Completed   HPV VACCINES  Aged Out    Health Maintenance  Health Maintenance Due  Topic Date Due   COVID-19 Vaccine (1) Never done   Hepatitis C Screening  Never done   Zoster Vaccines-  Shingrix (1 of 2) Never done   INFLUENZA VACCINE  04/01/2021   TETANUS/TDAP  05/19/2021    Colorectal cancer screening: No longer required.   Mammogram status: Completed 12/12/20 . Repeat every year- aged out  Bone Density status: Completed 06/15/17. Results reflect: Bone  density results: NORMAL. Repeat every 5 years.- declined referral  Lung Cancer Screening: (Low Dose CT Chest recommended if Age 85-80 years, 30 pack-year currently smoking OR have quit w/in 15years.) does not qualify.    Additional Screening:  Hepatitis C Screening: does qualify; Completed no  Vision Screening: Recommended annual ophthalmology exams for early detection of glaucoma and other disorders of the eye. Is the patient up to date with their annual eye exam?  Yes  Who is the provider or what is the name of the office in which the patient attends annual eye exams? Dr.Thurmond If pt is not established with a provider, would they like to be referred to a provider to establish care? No .   Dental Screening: Recommended annual dental exams for proper oral hygiene  Community Resource Referral / Chronic Care Management: CRR required this visit?  No   CCM required this visit?  No      Plan:     I have personally reviewed and noted the following in the patients chart:   Medical and social history Use of alcohol, tobacco or illicit drugs  Current medications and supplements including opioid prescriptions.  Functional ability and status Nutritional status Physical activity Advanced directives List of other physicians Hospitalizations, surgeries, and ER visits in previous 12 months Vitals Screenings to include cognitive, depression, and falls Referrals and appointments  In addition, I have reviewed and discussed with patient certain preventive protocols, quality metrics, and best practice recommendations. A written personalized care plan for preventive services as well as general preventive health  recommendations were provided to patient.     Hal Hope, LPN   10/10/5186   Nurse Notes: none

## 2021-10-09 NOTE — Patient Instructions (Signed)
Danielle Jackson , Thank you for taking time to come for your Medicare Wellness Visit. I appreciate your ongoing commitment to your health goals. Please review the following plan we discussed and let me know if I can assist you in the future.   Screening recommendations/referrals: Colonoscopy: aged out Mammogram: aged out Bone Density: declined referral Recommended yearly ophthalmology/optometry visit for glaucoma screening and checkup Recommended yearly dental visit for hygiene and checkup  Vaccinations: Influenza vaccine: 06/13/21 Pneumococcal vaccine: 06/06/14 Tdap vaccine: 05/20/11, due Shingles vaccine: n/d   Covid-19:n/d  Advanced directives: no  Conditions/risks identified: none  Next appointment: Follow up in one year for your annual wellness visit - declined appointment   Preventive Care 65 Years and Older, Female Preventive care refers to lifestyle choices and visits with your health care provider that can promote health and wellness. What does preventive care include? A yearly physical exam. This is also called an annual well check. Dental exams once or twice a year. Routine eye exams. Ask your health care provider how often you should have your eyes checked. Personal lifestyle choices, including: Daily care of your teeth and gums. Regular physical activity. Eating a healthy diet. Avoiding tobacco and drug use. Limiting alcohol use. Practicing safe sex. Taking low-dose aspirin every day. Taking vitamin and mineral supplements as recommended by your health care provider. What happens during an annual well check? The services and screenings done by your health care provider during your annual well check will depend on your age, overall health, lifestyle risk factors, and family history of disease. Counseling  Your health care provider may ask you questions about your: Alcohol use. Tobacco use. Drug use. Emotional well-being. Home and relationship well-being. Sexual  activity. Eating habits. History of falls. Memory and ability to understand (cognition). Work and work Astronomer. Reproductive health. Screening  You may have the following tests or measurements: Height, weight, and BMI. Blood pressure. Lipid and cholesterol levels. These may be checked every 5 years, or more frequently if you are over 66 years old. Skin check. Lung cancer screening. You may have this screening every year starting at age 42 if you have a 30-pack-year history of smoking and currently smoke or have quit within the past 15 years. Fecal occult blood test (FOBT) of the stool. You may have this test every year starting at age 85. Flexible sigmoidoscopy or colonoscopy. You may have a sigmoidoscopy every 5 years or a colonoscopy every 10 years starting at age 54. Hepatitis C blood test. Hepatitis B blood test. Sexually transmitted disease (STD) testing. Diabetes screening. This is done by checking your blood sugar (glucose) after you have not eaten for a while (fasting). You may have this done every 1-3 years. Bone density scan. This is done to screen for osteoporosis. You may have this done starting at age 49. Mammogram. This may be done every 1-2 years. Talk to your health care provider about how often you should have regular mammograms. Talk with your health care provider about your test results, treatment options, and if necessary, the need for more tests. Vaccines  Your health care provider may recommend certain vaccines, such as: Influenza vaccine. This is recommended every year. Tetanus, diphtheria, and acellular pertussis (Tdap, Td) vaccine. You may need a Td booster every 10 years. Zoster vaccine. You may need this after age 53. Pneumococcal 13-valent conjugate (PCV13) vaccine. One dose is recommended after age 39. Pneumococcal polysaccharide (PPSV23) vaccine. One dose is recommended after age 10. Talk to your health care provider  about which screenings and vaccines  you need and how often you need them. This information is not intended to replace advice given to you by your health care provider. Make sure you discuss any questions you have with your health care provider. Document Released: 09/14/2015 Document Revised: 05/07/2016 Document Reviewed: 06/19/2015 Elsevier Interactive Patient Education  2017 [place] Prevention in the Home Falls can cause injuries. They can happen to people of all ages. There are many things you can do to make your home safe and to help prevent falls. What can I do on the outside of my home? Regularly fix the edges of walkways and driveways and fix any cracks. Remove anything that might make you trip as you walk through a door, such as a raised step or threshold. Trim any bushes or trees on the path to your home. Use bright outdoor lighting. Clear any walking paths of anything that might make someone trip, such as rocks or tools. Regularly check to see if handrails are loose or broken. Make sure that both sides of any steps have handrails. Any raised decks and porches should have guardrails on the edges. Have any leaves, snow, or ice cleared regularly. Use sand or salt on walking paths during winter. Clean up any spills in your garage right away. This includes oil or grease spills. What can I do in the bathroom? Use night lights. Install grab bars by the toilet and in the tub and shower. Do not use towel bars as grab bars. Use non-skid mats or decals in the tub or shower. If you need to sit down in the shower, use a plastic, non-slip stool. Keep the floor dry. Clean up any water that spills on the floor as soon as it happens. Remove soap buildup in the tub or shower regularly. Attach bath mats securely with double-sided non-slip rug tape. Do not have throw rugs and other things on the floor that can make you trip. What can I do in the bedroom? Use night lights. Make sure that you have a light by your bed that  is easy to reach. Do not use any sheets or blankets that are too big for your bed. They should not hang down onto the floor. Have a firm chair that has side arms. You can use this for support while you get dressed. Do not have throw rugs and other things on the floor that can make you trip. What can I do in the kitchen? Clean up any spills right away. Avoid walking on wet floors. Keep items that you use a lot in easy-to-reach places. If you need to reach something above you, use a strong step stool that has a grab bar. Keep electrical cords out of the way. Do not use floor polish or wax that makes floors slippery. If you must use wax, use non-skid floor wax. Do not have throw rugs and other things on the floor that can make you trip. What can I do with my stairs? Do not leave any items on the stairs. Make sure that there are handrails on both sides of the stairs and use them. Fix handrails that are broken or loose. Make sure that handrails are as long as the stairways. Check any carpeting to make sure that it is firmly attached to the stairs. Fix any carpet that is loose or worn. Avoid having throw rugs at the top or bottom of the stairs. If you do have throw rugs, attach them to the floor  with carpet tape. Make sure that you have a light switch at the top of the stairs and the bottom of the stairs. If you do not have them, ask someone to add them for you. What else can I do to help prevent falls? Wear shoes that: Do not have high heels. Have rubber bottoms. Are comfortable and fit you well. Are closed at the toe. Do not wear sandals. If you use a stepladder: Make sure that it is fully opened. Do not climb a closed stepladder. Make sure that both sides of the stepladder are locked into place. Ask someone to hold it for you, if possible. Clearly mark and make sure that you can see: Any grab bars or handrails. First and last steps. Where the edge of each step is. Use tools that help you  move around (mobility aids) if they are needed. These include: Canes. Walkers. Scooters. Crutches. Turn on the lights when you go into a dark area. Replace any light bulbs as soon as they burn out. Set up your furniture so you have a clear path. Avoid moving your furniture around. If any of your floors are uneven, fix them. If there are any pets around you, be aware of where they are. Review your medicines with your doctor. Some medicines can make you feel dizzy. This can increase your chance of falling. Ask your doctor what other things that you can do to help prevent falls. This information is not intended to replace advice given to you by your health care provider. Make sure you discuss any questions you have with your health care provider. Document Released: 06/14/2009 Document Revised: 01/24/2016 Document Reviewed: 09/22/2014 Elsevier Interactive Patient Education  2017 [employer].

## 2021-10-29 ENCOUNTER — Other Ambulatory Visit: Payer: Self-pay

## 2021-10-29 ENCOUNTER — Encounter: Payer: Self-pay | Admitting: Family Medicine

## 2021-10-29 ENCOUNTER — Ambulatory Visit (INDEPENDENT_AMBULATORY_CARE_PROVIDER_SITE_OTHER): Payer: PPO | Admitting: Family Medicine

## 2021-10-29 VITALS — BP 130/70 | HR 68 | Temp 97.2°F | Resp 16 | Ht 65.0 in | Wt 197.8 lb

## 2021-10-29 DIAGNOSIS — E1169 Type 2 diabetes mellitus with other specified complication: Secondary | ICD-10-CM

## 2021-10-29 DIAGNOSIS — H543 Unqualified visual loss, both eyes: Secondary | ICD-10-CM

## 2021-10-29 DIAGNOSIS — E7800 Pure hypercholesterolemia, unspecified: Secondary | ICD-10-CM | POA: Diagnosis not present

## 2021-10-29 DIAGNOSIS — I152 Hypertension secondary to endocrine disorders: Secondary | ICD-10-CM | POA: Diagnosis not present

## 2021-10-29 DIAGNOSIS — E1165 Type 2 diabetes mellitus with hyperglycemia: Secondary | ICD-10-CM | POA: Diagnosis not present

## 2021-10-29 DIAGNOSIS — E1159 Type 2 diabetes mellitus with other circulatory complications: Secondary | ICD-10-CM

## 2021-10-29 LAB — POCT GLYCOSYLATED HEMOGLOBIN (HGB A1C)
Est. average glucose Bld gHb Est-mCnc: 148
Hemoglobin A1C: 6.8 % — AB (ref 4.0–5.6)

## 2021-10-29 IMAGING — MG MM DIGITAL SCREENING BILAT W/ TOMO AND CAD
6 of 12 series · 6 of 36 positions shown · non-contrast
Comparison: Previous exam(s).

CLINICAL DATA: Screening.

EXAM:
DIGITAL SCREENING BILATERAL MAMMOGRAM WITH TOMOSYNTHESIS AND CAD
TECHNIQUE: Bilateral screening digital craniocaudal and mediolateral oblique
mammograms were obtained. Bilateral screening digital breast
tomosynthesis was performed. The images were evaluated with
computer-aided detection.

[R CC synth-2D]
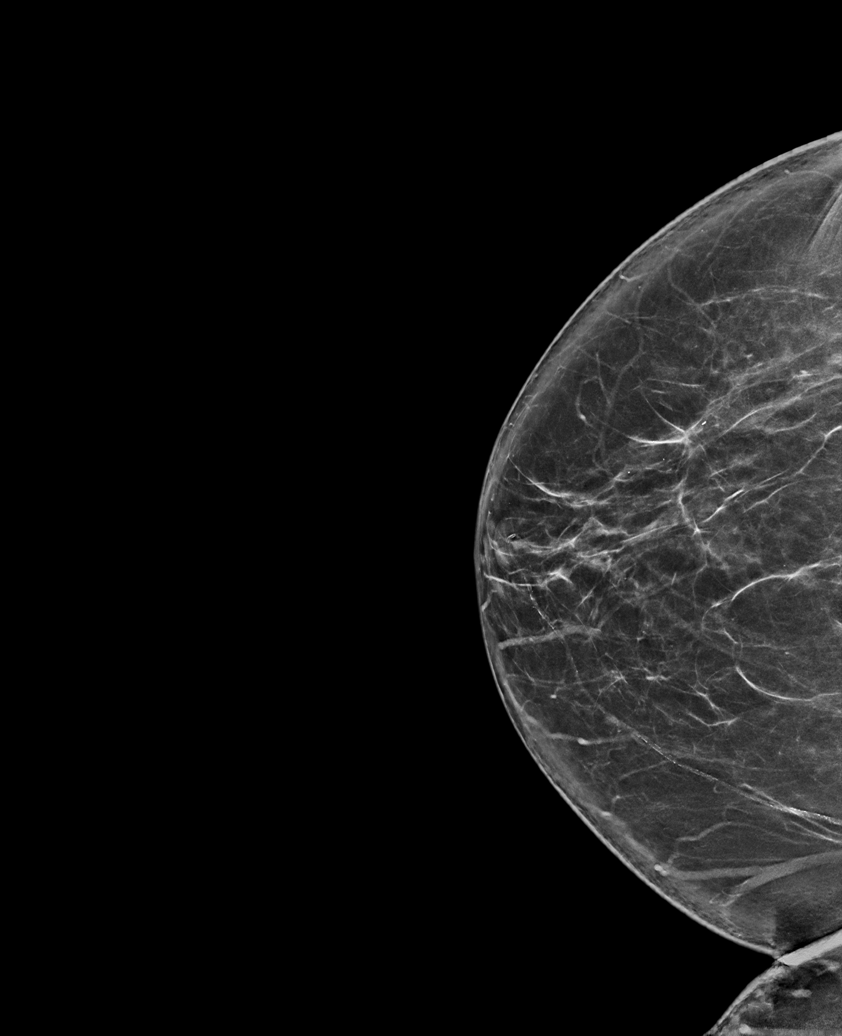

[L CC synth-2D]
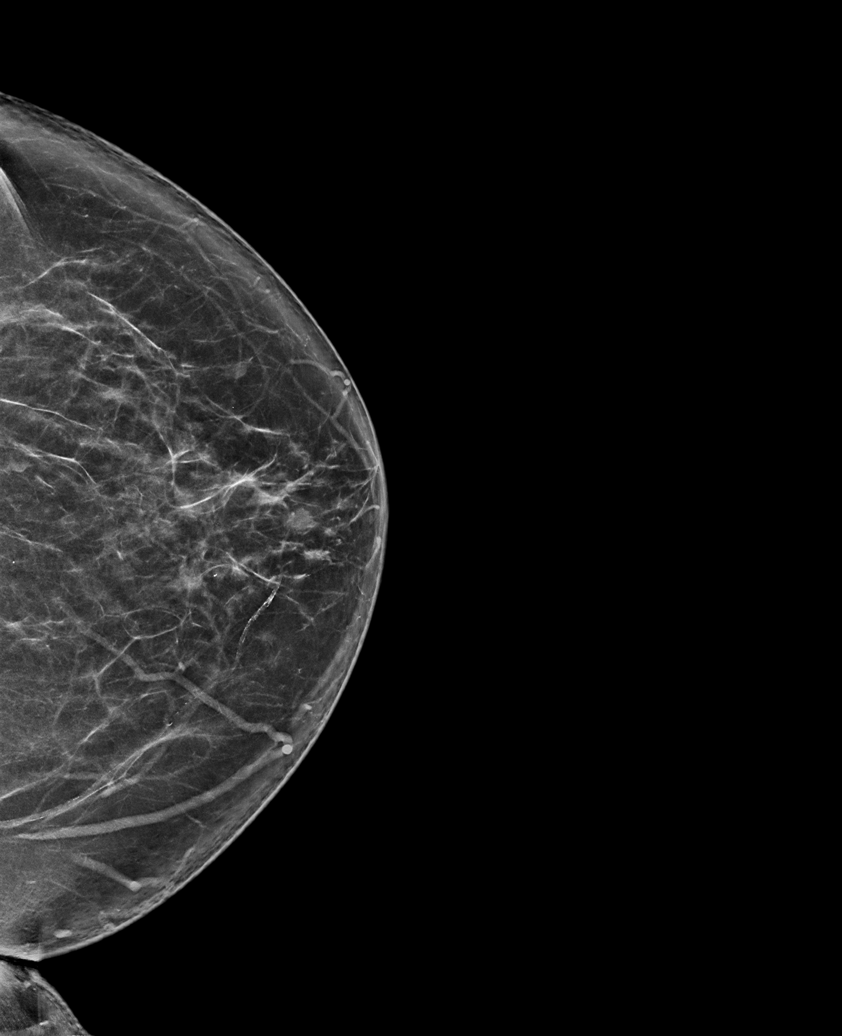

[L MLO synth-2D (1 of 2)]
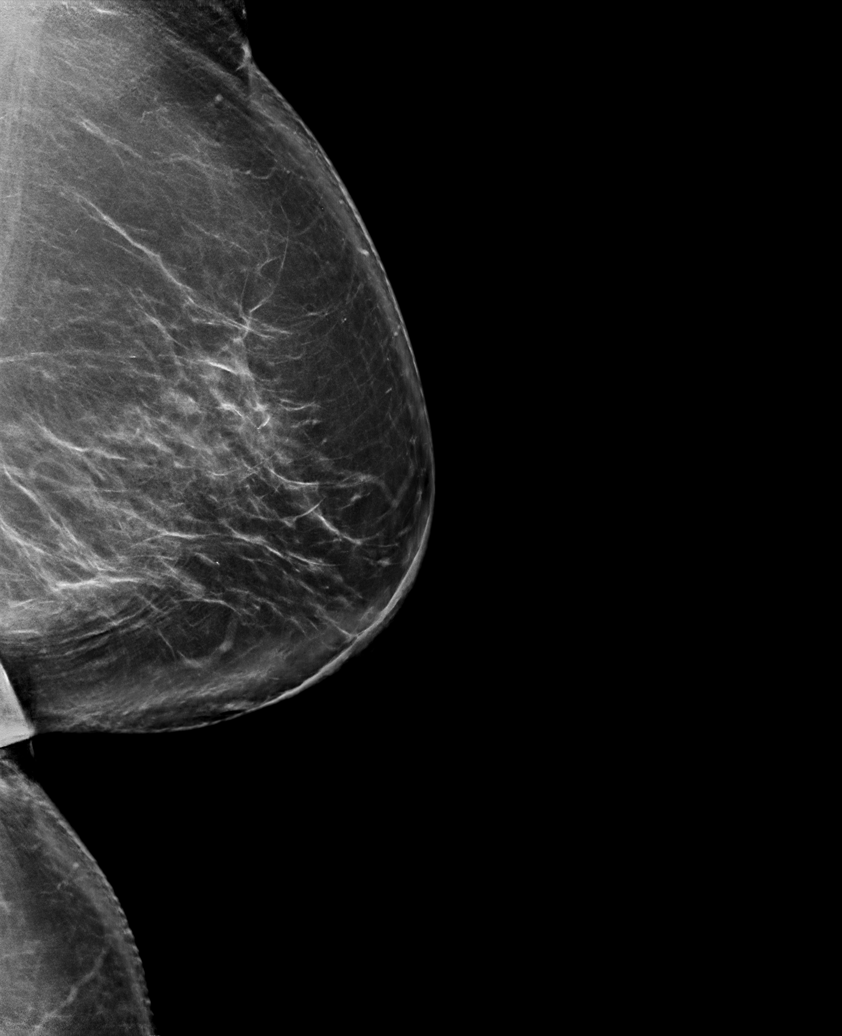

[R MLO synth-2D (1 of 2)]
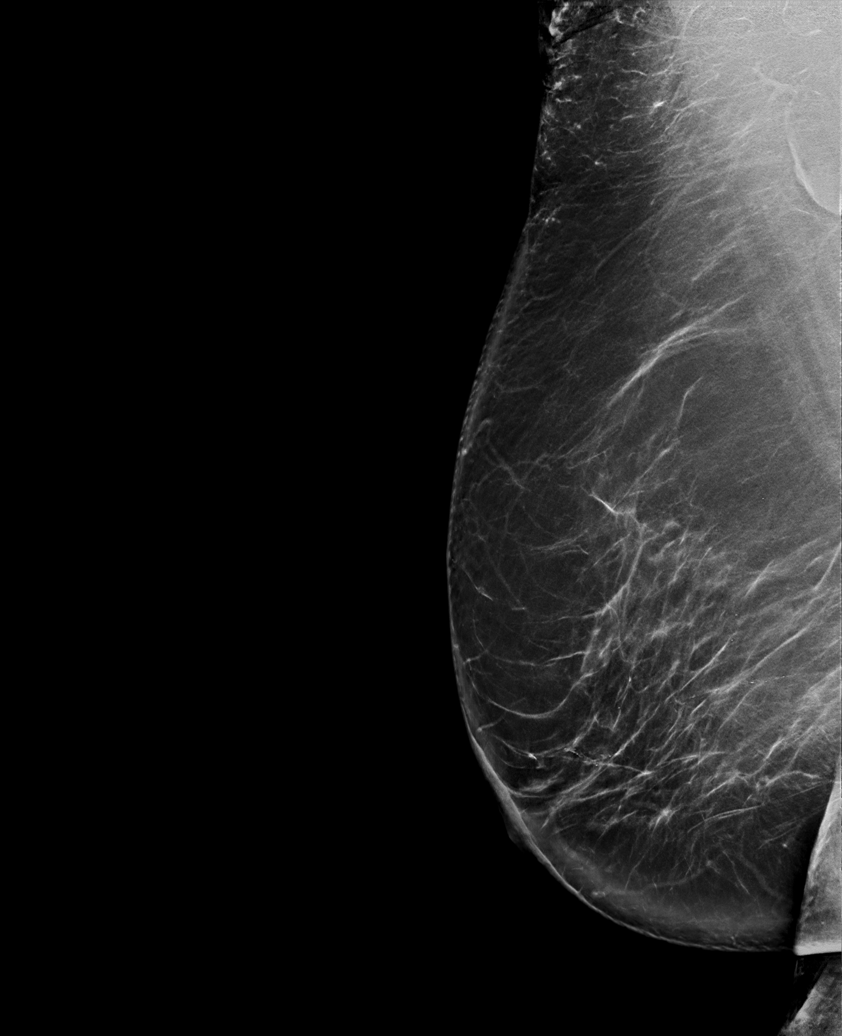

[R MLO synth-2D (2 of 2)]
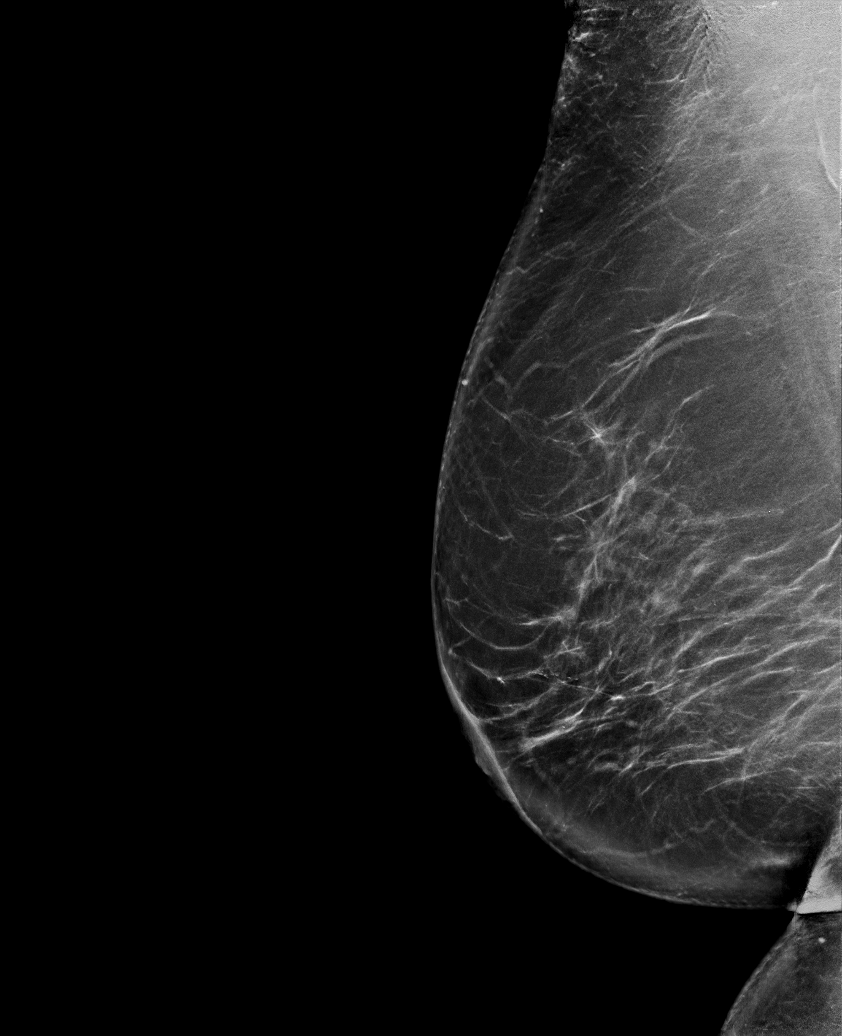

[L MLO synth-2D (2 of 2)]
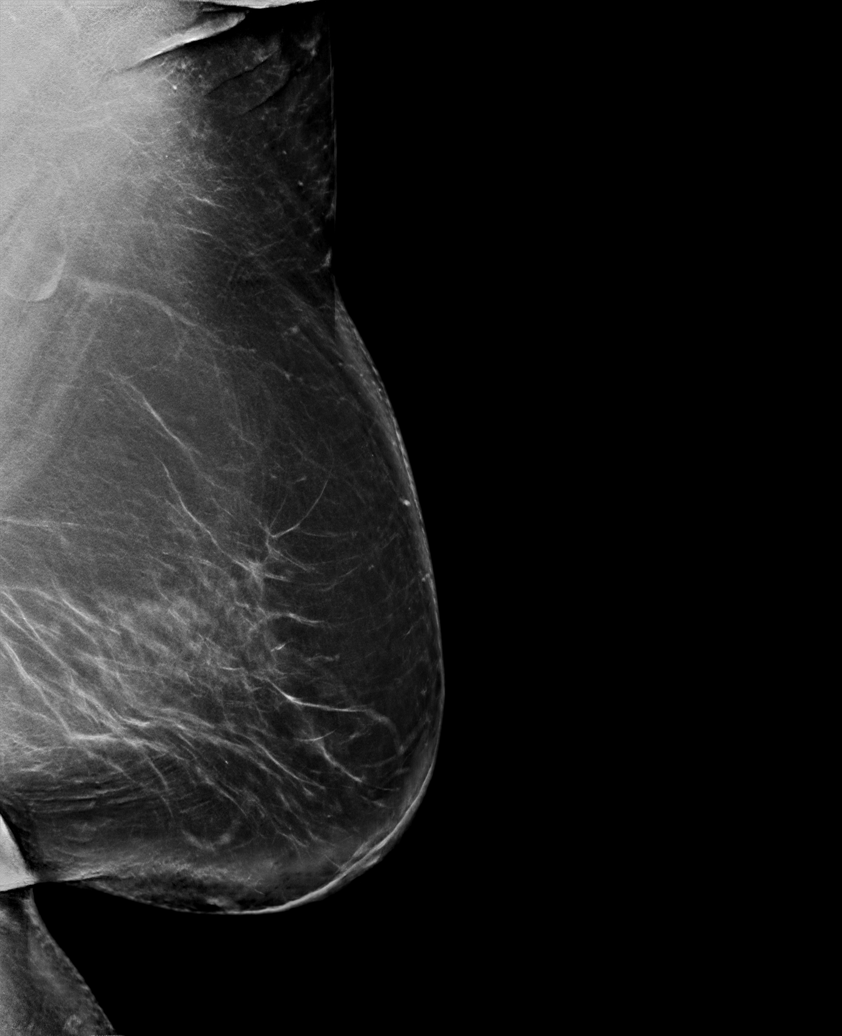

[6 of 36 positions shown; findings below may reference images not displayed]

ACR Breast Density Category b: There are scattered areas of
fibroglandular density.
FINDINGS: There are no findings suspicious for malignancy. The images were
evaluated with computer-aided detection.
IMPRESSION: No mammographic evidence of malignancy. A result letter of this
screening mammogram will be mailed directly to the patient.

RECOMMENDATION:
Screening mammogram in one year. (Code:[id number]-[id number])

BI-RADS CATEGORY  1: Negative.

## 2021-10-29 MED ORDER — GLIPIZIDE ER 5 MG PO TB24: ORAL_TABLET | ORAL | 3 refills | Status: DC

## 2021-10-29 MED ORDER — LOSARTAN POTASSIUM 50 MG PO TABS: 50.0000 mg | ORAL_TABLET | Freq: Every day | ORAL | 3 refills | Status: DC

## 2021-10-29 NOTE — Assessment & Plan Note (Addendum)
Well controlled with last A1c 6.8% Continue current medications Declines COVID vaccinations and Shingrix vaccinations in addition to TDAP vaccination, UTD eye exam, foot exam On ARB Declines Statin Discussed diet and exercise F/u in 6 months, per pt preference for CPE  Reinforce goal of LDL <70 We recommend diet low in saturated fat and regular exercise - 30 min at least 5 times per week

## 2021-10-29 NOTE — Progress Notes (Signed)
Established patient visit   Patient: Danielle Jackson   DOB: Sep 19, 1940   81 y.o. Female  MRN: [id number] Visit Date: 10/29/2021  Today's healthcare provider: Gwyneth Sprout, FNP   I,Joseline E Rosas,acting as a scribe for Gwyneth Sprout, FNP.,have documented all relevant documentation on the behalf of Gwyneth Sprout, FNP,as directed by  Gwyneth Sprout, FNP while in the presence of Gwyneth Sprout, FNP.   Chief Complaint  Patient presents with   follow-up chronic disease   Subjective    HPI  Diabetes Mellitus Type II, follow-up  Lab Results  Component Value Date   HGBA1C 6.8 (A) 10/29/2021   HGBA1C 6.3 (A) 07/31/2021   HGBA1C 6.7 (A) 04/26/2021   Last seen for diabetes 3 months ago.  Management since then includes continuing the same treatment. She reports excellent compliance with treatment. She is not having side effects.   Home blood sugar records: fasting range: 130's-120's  Episodes of hypoglycemia? No   Most Recent Eye Exam: UTD; went to [facility] in 08/2021  --------------------------------------------------------------------------------------------------- Hypertension, follow-up  BP Readings from Last 3 Encounters:  10/29/21 130/70  07/31/21 110/70  04/26/21 (!) 148/70   Wt Readings from Last 3 Encounters:  10/29/21 197 lb 12.8 oz (89.7 kg)  07/31/21 192 lb 14.4 oz (87.5 kg)  04/26/21 198 lb (89.8 kg)     She was last seen for hypertension 3 months ago.  BP at that visit was 110/70. Management since that visit includes continue current meds. She reports excellent compliance with treatment. She is not having side effects.  She is exercising. She is adherent to low salt diet.   Outside blood pressures are 130/70's.   --------------------------------------------------------------------------------------------------- Lipid/Cholesterol, follow-up  Last Lipid Panel: Lab Results  Component Value Date   CHOL 202 (H) 06/04/2021   LDLCALC 123 (H)  06/04/2021   HDL 40 06/04/2021   TRIG 218 (H) 06/04/2021    She was last seen for this 3 months ago.  Management since that visit includes diet low in saturated fat and regular exercise - 30 min at least 5 times per week.  She reports excellent compliance with treatment. She is not having side effects.   Symptoms: No appetite changes No foot ulcerations  No chest pain No chest pressure/discomfort  No dyspnea No orthopnea  No fatigue No lower extremity edema  No palpitations No paroxysmal nocturnal dyspnea  No nausea No numbness or tingling of extremity  No polydipsia No polyuria  No speech difficulty No syncope     Last metabolic panel Lab Results  Component Value Date   GLUCOSE 137 (H) 05/01/2021   NA 142 05/01/2021   K 4.1 05/01/2021   BUN 20 05/01/2021   CREATININE 0.95 05/01/2021   EGFR 61 05/01/2021   GFRNONAA 50 (L) 02/09/2020   CALCIUM 9.1 05/01/2021   AST 13 05/01/2021   ALT 16 05/01/2021   The 10-year ASCVD risk score (Arnett DK, et al., 2019) is: 50.4%  ---------------------------------------------------------------------------------------------------   Medications: Outpatient Medications Prior to Visit  Medication Sig   atorvastatin (LIPITOR) 10 MG tablet Take 1 tablet (10 mg total) by mouth daily.   CALCIUM CARBONATE-VIT D-MIN PO Take by mouth 2 (two) times daily.   Cholecalciferol (VITAMIN D) 2000 UNITS tablet Take by mouth daily.   fluticasone (FLONASE) 50 MCG/ACT nasal spray Place into the nose daily.   glucose blood (ONETOUCH ULTRA) test strip USE ONE STRIP TO CHECK GLUCOSE ONCE DAILY  ibuprofen (ADVIL,MOTRIN) 200 MG tablet Take by mouth as needed.   [DISCONTINUED] FLUAD QUADRIVALENT 0.5 ML injection    [DISCONTINUED] glipiZIDE (GLUCOTROL XL) 5 MG 24 hr tablet TAKE 1 TABLET BY MOUTH ONCE A DAY   [DISCONTINUED] losartan (COZAAR) 50 MG tablet Take 1 tablet (50 mg total) by mouth daily.   [DISCONTINUED] Loratadine 10 MG CAPS Take by mouth daily. As  needed (Patient not taking: Reported on 10/09/2021)   [DISCONTINUED] Probiotic Product (FORTIFY DAILY PROBIOTIC PO) Take by mouth. (Patient not taking: Reported on 10/09/2021)   No facility-administered medications prior to visit.    Review of Systems      Objective    BP 130/70 Comment: home   Pulse 68    Temp (!) 97.2 F (36.2 C) (Temporal)    Resp 16    Ht 5' 5" (1.651 m)    Wt 197 lb 12.8 oz (89.7 kg)    BMI 32.92 kg/m     Physical Exam Vitals and nursing note reviewed.  Constitutional:      General: She is not in acute distress.    Appearance: Normal appearance. She is obese. She is not ill-appearing, toxic-appearing or diaphoretic.  HENT:     Head: Normocephalic and atraumatic.  Cardiovascular:     Rate and Rhythm: Normal rate and regular rhythm.     Pulses: Normal pulses.     Heart sounds: Normal heart sounds. No murmur heard.   No friction rub. No gallop.  Pulmonary:     Effort: Pulmonary effort is normal. No respiratory distress.     Breath sounds: Normal breath sounds. No stridor. No wheezing, rhonchi or rales.  Chest:     Chest wall: No tenderness.  Abdominal:     General: Bowel sounds are normal.     Palpations: Abdomen is soft.  Musculoskeletal:        General: No swelling, tenderness, deformity or signs of injury. Normal range of motion.     Right lower leg: No edema.     Left lower leg: No edema.  Skin:    General: Skin is warm and dry.     Capillary Refill: Capillary refill takes less than 2 seconds.     Coloration: Skin is not jaundiced or pale.     Findings: No bruising, erythema, lesion or rash.  Neurological:     General: No focal deficit present.     Mental Status: She is alert and oriented to person, place, and time. Mental status is at baseline.     Cranial Nerves: No cranial nerve deficit.     Sensory: No sensory deficit.     Motor: No weakness.     Coordination: Coordination normal.  Psychiatric:        Mood and Affect: Mood normal.         Behavior: Behavior normal.        Thought Content: Thought content normal.        Judgment: Judgment normal.     Results for orders placed or performed in visit on 10/29/21  POCT glycosylated hemoglobin (Hb A1C)  Result Value Ref Range   Hemoglobin A1C 6.8 (A) 4.0 - 5.6 %   Est. average glucose Bld gHb Est-mCnc 148     Assessment & Plan     Problem List Items Addressed This Visit       Cardiovascular and Mediastinum   Hypertension associated with diabetes ([place])    Chronic, stable on home readings; reinforce goal <140/<80 Denies CP  Denies SOB Denies DOE No LE Edema noted on exam Continue medication- losartan Refills provided Seek emergent care if you develop CP, chest pain or chest pressure       Relevant Medications   glipiZIDE (GLUCOTROL XL) 5 MG 24 hr tablet   losartan (COZAAR) 50 MG tablet     Endocrine   Type 2 diabetes mellitus with hypercholesterolemia (HCC)    Well controlled with last A1c 6.8% Continue current medications Declines COVID vaccinations and Shingrix vaccinations in addition to TDAP vaccination, UTD eye exam, foot exam On ARB Declines Statin Discussed diet and exercise F/u in 6 months, per pt preference for CPE  Reinforce goal of LDL <70 We recommend diet low in saturated fat and regular exercise - 30 min at least 5 times per week       Relevant Medications   glipiZIDE (GLUCOTROL XL) 5 MG 24 hr tablet   losartan (COZAAR) 50 MG tablet   Type 2 diabetes mellitus with hyperglycemia, without long-term current use of insulin (HCC) - Primary    Repeat A1c today; results show A1c 6.8% Complaints of hyperglycemia; however, does not wish to increase medication at this time Continue glipizide 73m QD- refills sent  T2DM - Checking BG at home: yes, FBG 120/130's - Medications: Glipizide 553mQD - Compliance: excellent - Diet: general - Exercise: walking, around home - eye exam: UTD; went 08/2021, reports failed cataract repair in L eye, needs  cornea transplant; does not wish to complete d/t age - foot exam: UTD; done; 12/2020 - microalbumin: UTD, done 12/2020 - denies symptoms of hypoglycemia, polyuria, polydipsia, numbness extremities, foot ulcers/trauma       Relevant Medications   glipiZIDE (GLUCOTROL XL) 5 MG 24 hr tablet   losartan (COZAAR) 50 MG tablet   Other Relevant Orders   POCT glycosylated hemoglobin (Hb A1C) (Completed)     Other   Impaired vision in both eyes    S/s DM UTD on DM eye exam, completed 08/2021 Reported failed cataract repair in L eye Needs cornea transplant- does not wish to complete d/t age Declines difficulty with driving or ADLs No change in Rx of glasses No sign of glaucoma or infection Reports allergy symptoms; encouraged OTC drops as needed        Return in about 6 months (around 04/28/2022) for annual examination.      I,Vonna KotykFNP, have reviewed all documentation for this visit. The documentation on 10/29/21 for the exam, diagnosis, procedures, and orders are all accurate and complete.    ElGwyneth [phone]) [phone])  [place]

## 2021-10-29 NOTE — Assessment & Plan Note (Addendum)
Repeat A1c today; results show A1c 6.8% Complaints of hyperglycemia; however, does not wish to increase medication at this time Continue glipizide 5mg  QD- refills sent  T2DM - Checking BG at home: yes, FBG 120/130's - Medications: Glipizide 5mg  QD - Compliance: excellent - Diet: general - Exercise: walking, around home - eye exam: UTD; went 08/2021, reports failed cataract repair in L eye, needs cornea transplant; does not wish to complete d/t age - foot exam: UTD; done; 12/2020 - microalbumin: UTD, done 12/2020 - denies symptoms of hypoglycemia, polyuria, polydipsia, numbness extremities, foot ulcers/trauma

## 2021-10-29 NOTE — Assessment & Plan Note (Addendum)
Chronic, stable on home readings; reinforce goal <140/<80 Denies CP Denies SOB Denies DOE No LE Edema noted on exam Continue medication- losartan Refills provided Seek emergent care if you develop CP, chest pain or chest pressure

## 2021-10-29 NOTE — Assessment & Plan Note (Signed)
S/s DM UTD on DM eye exam, completed 08/2021 Reported failed cataract repair in L eye Needs cornea transplant- does not wish to complete d/t age Declines difficulty with driving or ADLs No change in Rx of glasses No sign of glaucoma or infection Reports allergy symptoms; encouraged OTC drops as needed

## 2022-03-10 ENCOUNTER — Other Ambulatory Visit: Payer: Self-pay | Admitting: Family Medicine

## 2022-03-10 DIAGNOSIS — Z1231 Encounter for screening mammogram for malignant neoplasm of breast: Secondary | ICD-10-CM

## 2022-04-03 ENCOUNTER — Ambulatory Visit
Admission: RE | Admit: 2022-04-03 | Discharge: 2022-04-03 | Disposition: A | Discharge status: Home / Self Care | Payer: PPO | Source: Ambulatory Visit | Attending: Family Medicine | Admitting: Family Medicine

## 2022-04-03 DIAGNOSIS — Z1231 Encounter for screening mammogram for malignant neoplasm of breast: Secondary | ICD-10-CM | POA: Diagnosis not present

## 2022-04-04 NOTE — Progress Notes (Signed)
Hi Danielle Jackson  Normal mammogram; repeat in 1 year.  Please let us know if you have any questions.  Thank you,   , FNP 

## 2022-04-29 ENCOUNTER — Encounter: Payer: PPO | Admitting: Family Medicine

## 2022-05-06 NOTE — Progress Notes (Signed)
Complete physical exam   Patient: Danielle Jackson   DOB: 1940/11/15   81 y.o. Female  MRN: [id number] Visit Date: 05/08/2022  Today's healthcare provider: Gwyneth Sprout, FNP  Re Introduced to nurse practitioner role and practice setting.  All questions answered.  Discussed provider/patient relationship and expectations.   I,Tiffany J Bragg,acting as a scribe for Gwyneth Sprout, FNP.,have documented all relevant documentation on the behalf of Gwyneth Sprout, FNP,as directed by  Gwyneth Sprout, FNP while in the presence of Gwyneth Sprout, FNP.   Chief Complaint  Patient presents with   Annual Exam   Subjective    Danielle Jackson is a 81 y.o. female who presents today for a complete physical exam.  She reports consuming a general diet. The patient does not participate in regular exercise at present. She generally feels fairly well. She reports sleeping well. She does not have additional problems to discuss today.  HPI   Past Medical History:  Diagnosis Date   Diabetes mellitus without complication ([place])    Hyperlipidemia    Hypertension    Past Surgical History:  Procedure Laterality Date   ABDOMINAL HYSTERECTOMY  1975   CATARACT EXTRACTION  03/01/2001   KNEE SURGERY  03/24/2014   Social History   Socioeconomic History   Marital status: Married    Spouse name: Not on file   Number of children: 2   Years of education: Not on file   Highest education level: High school graduate  Occupational History   Not on file  Tobacco Use   Smoking status: Never   Smokeless tobacco: Never  Substance and Sexual Activity   Alcohol use: No    Alcohol/week: 0.0 standard drinks of alcohol   Drug use: No   Sexual activity: Not on file  Other Topics Concern   Not on file  Social History Narrative   Not on file   Social Determinants of Health   Financial Resource Strain: Low Risk  (10/09/2021)   Overall Financial Resource Strain (CARDIA)    Difficulty of Paying Living Expenses: Not  hard at all  Food Insecurity: No Food Insecurity (10/09/2021)   Hunger Vital Sign    Worried About Running Out of Food in the Last Year: Never true    [place] in the Last Year: Never true  Transportation Needs: No Transportation Needs (10/09/2021)   PRAPARE - Hydrologist (Medical): No    Lack of Transportation (Non-Medical): No  Physical Activity: Insufficiently Active (10/09/2021)   Exercise Vital Sign    Days of Exercise per Week: 2 days    Minutes of Exercise per Session: 30 min  Stress: No Stress Concern Present (10/09/2021)   [address]    Feeling of Stress : Not at all  Social Connections: Moderately Integrated (10/09/2021)   Social Connection and Isolation Panel [NHANES]    Frequency of Communication with Friends and Family: More than three times a week    Frequency of Social Gatherings with Friends and Family: More than three times a week    Attends Religious Services: Never    Marine scientist or Organizations: Yes    Attends Archivist Meetings: Never    Marital Status: Married  Human resources officer Violence: Not At Risk (10/09/2021)   Humiliation, Afraid, Rape, and Kick questionnaire    Fear of Current or Ex-Partner: No  Emotionally Abused: No    Physically Abused: No    Sexually Abused: No   Family Status  Relation Name Status   Mother  Deceased   Father  Deceased   Sister  Alive   Brother  Alive   Neg Hx  (Not Specified)   Family History  Problem Relation Age of Onset   Diabetes Mother    Heart disease Mother    Hyperlipidemia Mother    Hypertension Mother    Arthritis Mother    Mental illness Mother    Asthma Father    Cancer Father    Hypertension Sister    Asthma Brother    Breast cancer Neg Hx    Allergies  Allergen Reactions   Amoxicillin Diarrhea   Cefprozil    Hydrochlorothiazide Cough   Lovastatin     Other reaction(s): Muscle Cramps    Medrol  [Methylprednisolone]    Metformin Hcl Diarrhea    Severe Diarrhea   Metronidazole     diarrhea   Nitrofurantoin Monohyd Macro     Other reaction(s): Wheezing   Pravastatin Sodium     Other reaction(s): Muscle Pain   Tetracycline Hives   Clarithromycin Rash    Patient Care Team: Gwyneth Sprout, FNP as PCP - General (Family Medicine) Cleaster Corin, OD (Optometry)   Medications: Outpatient Medications Prior to Visit  Medication Sig   atorvastatin (LIPITOR) 10 MG tablet Take 1 tablet (10 mg total) by mouth daily.   CALCIUM CARBONATE-VIT D-MIN PO Take by mouth 2 (two) times daily.   Cholecalciferol (VITAMIN D) 2000 UNITS tablet Take by mouth daily.   fluticasone (FLONASE) 50 MCG/ACT nasal spray Place into the nose daily.   glipiZIDE (GLUCOTROL XL) 5 MG 24 hr tablet TAKE 1 TABLET BY MOUTH ONCE A DAY   glucose blood (ONETOUCH ULTRA) test strip USE ONE STRIP TO CHECK GLUCOSE ONCE DAILY   ibuprofen (ADVIL,MOTRIN) 200 MG tablet Take by mouth as needed.   losartan (COZAAR) 50 MG tablet Take 1 tablet (50 mg total) by mouth daily.   No facility-administered medications prior to visit.    Review of Systems  Last CBC Lab Results  Component Value Date   WBC 5.8 05/01/2021   HGB 13.5 05/01/2021   HCT 39.0 05/01/2021   MCV 97 05/01/2021   MCH 33.4 (H) 05/01/2021   RDW 13.2 05/01/2021   PLT 181 12/45/8099   Last metabolic panel Lab Results  Component Value Date   GLUCOSE 137 (H) 05/01/2021   NA 142 05/01/2021   K 4.1 05/01/2021   CL 107 (H) 05/01/2021   CO2 20 05/01/2021   BUN 20 05/01/2021   CREATININE 0.95 05/01/2021   EGFR 61 05/01/2021   CALCIUM 9.1 05/01/2021   PHOS 3.6 05/05/2017   PROT 6.6 05/01/2021   ALBUMIN 4.1 05/01/2021   LABGLOB 2.5 05/01/2021   AGRATIO 1.6 05/01/2021   BILITOT 0.4 05/01/2021   ALKPHOS 75 05/01/2021   AST 13 05/01/2021   ALT 16 05/01/2021   ANIONGAP 10 09/03/2018   Last lipids Lab Results  Component Value Date   CHOL 202 (H)  06/04/2021   HDL 40 06/04/2021   LDLCALC 123 (H) 06/04/2021   TRIG 218 (H) 06/04/2021   CHOLHDL 5.1 (H) 06/04/2021   Last hemoglobin A1c Lab Results  Component Value Date   HGBA1C 6.8 (A) 10/29/2021   Last thyroid functions Lab Results  Component Value Date   TSH 2.890 05/01/2021      Objective  BP 128/80 (BP Location: Left Arm, Patient Position: Sitting, Cuff Size: Large)   Pulse 68   Temp 97.7 F (36.5 C) (Oral)   Resp 16   Ht '5\' 4"'  (1.626 m)   Wt 191 lb (86.6 kg)   SpO2 98%   BMI 32.79 kg/m  BP Readings from Last 3 Encounters:  05/08/22 128/80  10/29/21 130/70  07/31/21 110/70   Wt Readings from Last 3 Encounters:  05/08/22 191 lb (86.6 kg)  10/29/21 197 lb 12.8 oz (89.7 kg)  07/31/21 192 lb 14.4 oz (87.5 kg)   SpO2 Readings from Last 3 Encounters:  05/08/22 98%  07/31/21 100%  04/26/21 100%       Physical Exam Vitals and nursing note reviewed.  Constitutional:      General: She is awake. She is not in acute distress.    Appearance: Normal appearance. She is well-developed and well-groomed. She is obese. She is not ill-appearing, toxic-appearing or diaphoretic.  HENT:     Head: Normocephalic and atraumatic.     Jaw: There is normal jaw occlusion. No trismus, tenderness, swelling or pain on movement.     Right Ear: Hearing, tympanic membrane, ear canal and external ear normal. There is no impacted cerumen.     Left Ear: Hearing, tympanic membrane, ear canal and external ear normal. There is no impacted cerumen.     Nose: Nose normal. No congestion or rhinorrhea.     Right Turbinates: Not enlarged, swollen or pale.     Left Turbinates: Not enlarged, swollen or pale.     Right Sinus: No maxillary sinus tenderness or frontal sinus tenderness.     Left Sinus: No maxillary sinus tenderness or frontal sinus tenderness.     Mouth/Throat:     Lips: Pink.     Mouth: Mucous membranes are moist. No injury.     Tongue: No lesions.     Pharynx: Oropharynx is  clear. Uvula midline. No pharyngeal swelling, oropharyngeal exudate, posterior oropharyngeal erythema or uvula swelling.     Tonsils: No tonsillar exudate or tonsillar abscesses.  Eyes:     General: Lids are normal. Lids are everted, no foreign bodies appreciated. Vision grossly intact. Gaze aligned appropriately. No allergic shiner or visual field deficit.       Right eye: No discharge.        Left eye: No discharge.     Extraocular Movements: Extraocular movements intact.     Conjunctiva/sclera: Conjunctivae normal.     Right eye: Right conjunctiva is not injected. No exudate.    Left eye: Left conjunctiva is not injected. No exudate.    Pupils: Pupils are equal, round, and reactive to light.  Neck:     Thyroid: No thyroid mass, thyromegaly or thyroid tenderness.     Vascular: No carotid bruit.     Trachea: Trachea normal.  Cardiovascular:     Rate and Rhythm: Normal rate and regular rhythm.     Pulses: Normal pulses.          Carotid pulses are 2+ on the right side and 2+ on the left side.      Radial pulses are 2+ on the right side and 2+ on the left side.       Dorsalis pedis pulses are 2+ on the right side and 2+ on the left side.       Posterior tibial pulses are 2+ on the right side and 2+ on the left side.     Heart sounds:  Normal heart sounds, S1 normal and S2 normal. No murmur heard.    No friction rub. No gallop.  Pulmonary:     Effort: Pulmonary effort is normal. No respiratory distress.     Breath sounds: Normal breath sounds and air entry. No stridor. No wheezing, rhonchi or rales.  Chest:     Chest wall: No tenderness.  Abdominal:     General: Abdomen is flat. Bowel sounds are normal. There is no distension.     Palpations: Abdomen is soft. There is no mass.     Tenderness: There is no abdominal tenderness. There is no right CVA tenderness, left CVA tenderness, guarding or rebound.     Hernia: No hernia is present.  Genitourinary:    Comments: Exam deferred; denies  complaints Musculoskeletal:        General: No swelling, tenderness, deformity or signs of injury. Normal range of motion.     Cervical back: Full passive range of motion without pain, normal range of motion and neck supple. No edema, rigidity or tenderness. No muscular tenderness.     Right lower leg: No edema.     Left lower leg: No edema.  Lymphadenopathy:     Cervical: No cervical adenopathy.     Right cervical: No superficial, deep or posterior cervical adenopathy.    Left cervical: No superficial, deep or posterior cervical adenopathy.  Skin:    General: Skin is warm and dry.     Capillary Refill: Capillary refill takes less than 2 seconds.     Coloration: Skin is not jaundiced or pale.     Findings: No bruising, erythema, lesion or rash.  Neurological:     General: No focal deficit present.     Mental Status: She is alert and oriented to person, place, and time. Mental status is at baseline.     GCS: GCS eye subscore is 4. GCS verbal subscore is 5. GCS motor subscore is 6.     Sensory: Sensation is intact. No sensory deficit.     Motor: Motor function is intact. No weakness.     Coordination: Coordination is intact. Coordination normal.     Gait: Gait is intact. Gait normal.  Psychiatric:        Attention and Perception: Attention and perception normal.        Mood and Affect: Mood and affect normal.        Speech: Speech normal.        Behavior: Behavior normal. Behavior is cooperative.        Thought Content: Thought content normal.        Cognition and Memory: Cognition and memory normal.        Judgment: Judgment normal.      Last depression screening scores    05/08/2022    9:31 AM 10/29/2021   10:06 AM 10/09/2021    3:10 PM  PHQ 2/9 Scores  PHQ - 2 Score 0 0 0  PHQ- 9 Score 0     Last fall risk screening    05/08/2022    9:31 AM  [place] in the past year? 0  Number falls in past yr: 0  Injury with Fall? 0  Risk for fall due to : No Fall Risks   Follow up Falls evaluation completed   Last Audit-C alcohol use screening    05/08/2022    9:32 AM  Alcohol Use Disorder Test (AUDIT)  1. How often do you have a drink containing  alcohol? 0  2. How many drinks containing alcohol do you have on a typical day when you are drinking? 0  3. How often do you have six or more drinks on one occasion? 0  AUDIT-C Score 0   A score of 3 or more in women, and 4 or more in men indicates increased risk for alcohol abuse, EXCEPT if all of the points are from question 1   No results found for any visits on 05/08/22.  Assessment & Plan    Routine Health Maintenance and Physical Exam  Exercise Activities and Dietary recommendations  Goals      DIET - EAT MORE FRUITS AND VEGETABLES     DIET - INCREASE WATER INTAKE     Recommend to drink at least 6-8 8oz glasses of water per day.        Immunization History  Administered Date(s) Administered   Influenza Split 06/25/2010, 05/20/2011   Influenza, High Dose Seasonal PF 06/06/2014, 06/08/2015, 06/03/2016, 06/24/2017, 07/02/2019   Influenza,inj,Quad PF,6+ Mos 05/03/2013   Influenza-Unspecified 06/01/2014, 06/03/2016, 07/01/2018, 07/01/2021   Pneumococcal Conjugate-13 06/06/2014   Pneumococcal Polysaccharide-23 07/03/2007   Td 08/02/2004   Tdap 05/20/2011   Zoster, Live 01/04/2013    Health Maintenance  Topic Date Due   Zoster Vaccines- Shingrix (1 of 2) Never done   FOOT EXAM  01/03/2022   INFLUENZA VACCINE  04/01/2022   HEMOGLOBIN A1C  04/28/2022   TETANUS/TDAP  10/29/2022 (Originally 05/19/2021)   DEXA SCAN  06/15/2022   OPHTHALMOLOGY EXAM  07/01/2022   Pneumonia Vaccine 12+ Years old  Completed   HPV VACCINES  Aged Out   COVID-19 Vaccine  Discontinued    Discussed health benefits of physical activity, and encouraged her to engage in regular exercise appropriate for her age and condition.  Problem List Items Addressed This Visit       Cardiovascular and Mediastinum    Hypertension associated with diabetes ([place])    Chronic, stable Continue Losartan 50 mg Repeat CBC and CMP Goal <130/<80; at goal       Relevant Orders   Comprehensive Metabolic Panel (CMET)   CBC with Differential/Platelet   Lipid panel   Hemoglobin A1c     Endocrine   Type 2 diabetes mellitus with hypercholesterolemia (HCC)    Chronic, previously elevated at 123 Reports intermittent use of lipitor 10 mg r/t to OA symptoms Repeat FLP I recommend diet low in saturated fat and regular exercise - 30 min at least 5 times per week       Relevant Orders   Comprehensive Metabolic Panel (CMET)   CBC with Differential/Platelet   Lipid panel   Hemoglobin A1c   Type 2 diabetes mellitus with hyperglycemia, without long-term current use of insulin (HCC)    Chronic, previous stable with use of Glipizide 5 mg daily Repeat A1c Continue to recommend balanced, lower carb meals. Smaller meal size, adding snacks. Choosing water as drink of choice and increasing purposeful exercise.       Relevant Orders   Comprehensive Metabolic Panel (CMET)   CBC with Differential/Platelet   Lipid panel   Hemoglobin A1c     Other   Annual physical exam - Primary    Denies complaints today UTD on mammo Wishes to wait on flu Things to do to keep yourself healthy  - Exercise at least 30-45 minutes a day, 3-4 days a week.  - Eat a low-fat diet with lots of fruits and vegetables, up to 7-9 servings per day.  -  Seatbelts can save your life. Wear them always.  - Smoke detectors on every level of your home, check batteries every year.  - Eye Doctor - have an eye exam every 1-2 years  - Safe sex - if you may be exposed to STDs, use a condom.  - Alcohol -  If you drink, do it moderately, less than 2 drinks per day.  - [place]. Choose someone to speak for you if you are not able.  - Depression is common in our stressful world.If you're feeling down or losing interest in things you  normally enjoy, please come in for a visit.  - Violence - If anyone is threatening or hurting you, please call immediately.        Relevant Orders   Comprehensive Metabolic Panel (CMET)   CBC with Differential/Platelet   TSH + free T4   Lipid panel   Hemoglobin A1c     Return in about 6 months (around 11/06/2022) for chonic disease management.     Vonna Kotyk, FNP, have reviewed all documentation for this visit. The documentation on 05/08/22 for the exam, diagnosis, procedures, and orders are all accurate and complete.    Gwyneth Sprout, [place] [phone] (phone) [phone] (fax)  [place]

## 2022-05-08 ENCOUNTER — Encounter: Payer: Self-pay | Admitting: Family Medicine

## 2022-05-08 ENCOUNTER — Ambulatory Visit (INDEPENDENT_AMBULATORY_CARE_PROVIDER_SITE_OTHER): Payer: PPO | Admitting: Family Medicine

## 2022-05-08 VITALS — BP 128/80 | HR 68 | Temp 97.7°F | Resp 16 | Ht 64.0 in | Wt 191.0 lb

## 2022-05-08 DIAGNOSIS — Z0000 Encounter for general adult medical examination without abnormal findings: Secondary | ICD-10-CM | POA: Insufficient documentation

## 2022-05-08 DIAGNOSIS — E1169 Type 2 diabetes mellitus with other specified complication: Secondary | ICD-10-CM

## 2022-05-08 DIAGNOSIS — E1159 Type 2 diabetes mellitus with other circulatory complications: Secondary | ICD-10-CM

## 2022-05-08 DIAGNOSIS — E1165 Type 2 diabetes mellitus with hyperglycemia: Secondary | ICD-10-CM

## 2022-05-08 DIAGNOSIS — E7800 Pure hypercholesterolemia, unspecified: Secondary | ICD-10-CM

## 2022-05-08 DIAGNOSIS — I152 Hypertension secondary to endocrine disorders: Secondary | ICD-10-CM

## 2022-05-08 NOTE — Assessment & Plan Note (Signed)
Chronic, previous stable with use of Glipizide 5 mg daily Repeat A1c Continue to recommend balanced, lower carb meals. Smaller meal size, adding snacks. Choosing water as drink of choice and increasing purposeful exercise.

## 2022-05-08 NOTE — Assessment & Plan Note (Signed)
Denies complaints today UTD on mammo Wishes to wait on flu Things to do to keep yourself healthy  - Exercise at least 30-45 minutes a day, 3-4 days a week.  - Eat a low-fat diet with lots of fruits and vegetables, up to 7-9 servings per day.  - Seatbelts can save your life. Wear them always.  - Smoke detectors on every level of your home, check batteries every year.  - Eye Doctor - have an eye exam every 1-2 years  - Safe sex - if you may be exposed to STDs, use a condom.  - Alcohol -  If you drink, do it moderately, less than 2 drinks per day.  - Health Care Power of Attorney. Choose someone to speak for you if you are not able.  - Depression is common in our stressful world.If you're feeling down or losing interest in things you normally enjoy, please come in for a visit.  - Violence - If anyone is threatening or hurting you, please call immediately.

## 2022-05-08 NOTE — Assessment & Plan Note (Signed)
Chronic, stable Continue Losartan 50 mg Repeat CBC and CMP Goal <130/<80; at goal

## 2022-05-08 NOTE — Assessment & Plan Note (Signed)
Chronic, previously elevated at 123 Reports intermittent use of lipitor 10 mg r/t to OA symptoms Repeat FLP I recommend diet low in saturated fat and regular exercise - 30 min at least 5 times per week

## 2022-05-09 LAB — CBC WITH DIFFERENTIAL/PLATELET
Basophils Absolute: 0 10*3/uL (ref 0.0–0.2)
Basos: 1 %
EOS (ABSOLUTE): 0.2 10*3/uL (ref 0.0–0.4)
Eos: 4 %
Hematocrit: 39.8 % (ref 34.0–46.6)
Hemoglobin: 13.7 g/dL (ref 11.1–15.9)
Immature Grans (Abs): 0 10*3/uL (ref 0.0–0.1)
Immature Granulocytes: 1 %
Lymphocytes Absolute: 2 10*3/uL (ref 0.7–3.1)
Lymphs: 32 %
MCH: 33.2 pg — ABNORMAL HIGH (ref 26.6–33.0)
MCHC: 34.4 g/dL (ref 31.5–35.7)
MCV: 96 fL (ref 79–97)
Monocytes Absolute: 0.5 10*3/uL (ref 0.1–0.9)
Monocytes: 8 %
Neutrophils Absolute: 3.5 10*3/uL (ref 1.4–7.0)
Neutrophils: 54 %
Platelets: 181 10*3/uL (ref 150–450)
RBC: 4.13 x10E6/uL (ref 3.77–5.28)
RDW: 12.8 % (ref 11.7–15.4)
WBC: 6.3 10*3/uL (ref 3.4–10.8)

## 2022-05-09 LAB — LIPID PANEL
Chol/HDL Ratio: 4.7 ratio — ABNORMAL HIGH (ref 0.0–4.4)
Cholesterol, Total: 183 mg/dL (ref 100–199)
HDL: 39 mg/dL — ABNORMAL LOW (ref >39)
LDL Chol Calc (NIH): 97 mg/dL (ref 0–99)
Triglycerides: 276 mg/dL — ABNORMAL HIGH (ref 0–149)
VLDL Cholesterol Cal: 47 mg/dL — ABNORMAL HIGH (ref 5–40)

## 2022-05-09 LAB — COMPREHENSIVE METABOLIC PANEL
ALT: 17 IU/L (ref 0–32)
AST: 19 IU/L (ref 0–40)
Albumin/Globulin Ratio: 1.7 (ref 1.2–2.2)
Albumin: 4 g/dL (ref 3.8–4.8)
Alkaline Phosphatase: 82 IU/L (ref 44–121)
BUN/Creatinine Ratio: 13 (ref 12–28)
BUN: 13 mg/dL (ref 8–27)
Bilirubin Total: 0.6 mg/dL (ref 0.0–1.2)
CO2: 23 mmol/L (ref 20–29)
Calcium: 9.2 mg/dL (ref 8.7–10.3)
Chloride: 104 mmol/L (ref 96–106)
Creatinine, Ser: 1 mg/dL (ref 0.57–1.00)
Globulin, Total: 2.4 g/dL (ref 1.5–4.5)
Glucose: 136 mg/dL — ABNORMAL HIGH (ref 70–99)
Potassium: 4.1 mmol/L (ref 3.5–5.2)
Sodium: 139 mmol/L (ref 134–144)
Total Protein: 6.4 g/dL (ref 6.0–8.5)
eGFR: 57 mL/min/{1.73_m2} — ABNORMAL LOW (ref >59)

## 2022-05-09 LAB — HEMOGLOBIN A1C
Est. average glucose Bld gHb Est-mCnc: 146 mg/dL
Hgb A1c MFr Bld: 6.7 % — ABNORMAL HIGH (ref 4.8–5.6)

## 2022-05-09 LAB — TSH+FREE T4
Free T4: 1.11 ng/dL (ref 0.82–1.77)
TSH: 2.36 u[IU]/mL (ref 0.450–4.500)

## 2022-05-09 NOTE — Progress Notes (Signed)
Blood chemistry shows slight elevation in blood sugar and decrease in kidney function with eGFR of 57. Continue to emphasize water to assist in kidney health- goal of 64 oz/day. Cholesterol has improved. I recommend diet low in saturated fat and regular exercise - 30 min at least 5 times per week. Continue to take the statin as you can tolerate for best heart attack/stroke risk reduction. A1c remains well controlled for Type 2 Diabetes at 6.7%. Continue to recommend balanced, lower carb meals. Smaller meal size, adding snacks. Choosing water as drink of choice and increasing purposeful exercise.  Gwyneth Sprout, [address] [place], [place] [address] [phone] (phone) [phone] (fax) [place]

## 2022-10-06 ENCOUNTER — Other Ambulatory Visit: Payer: Self-pay | Admitting: Family Medicine

## 2022-10-06 DIAGNOSIS — E119 Type 2 diabetes mellitus without complications: Secondary | ICD-10-CM

## 2022-10-08 ENCOUNTER — Other Ambulatory Visit: Payer: Self-pay | Admitting: Family Medicine

## 2022-10-29 ENCOUNTER — Telehealth: Payer: Self-pay | Admitting: Family Medicine

## 2022-10-29 NOTE — Telephone Encounter (Signed)
Contacted [name] to schedule their annual wellness visit. Appointment made for 12/24/2022.  [place] Direct Dial: [phone]

## 2022-11-04 ENCOUNTER — Ambulatory Visit (INDEPENDENT_AMBULATORY_CARE_PROVIDER_SITE_OTHER): Payer: PPO | Admitting: Family Medicine

## 2022-11-04 ENCOUNTER — Encounter: Payer: Self-pay | Admitting: Family Medicine

## 2022-11-04 VITALS — BP 120/80 | HR 66 | Temp 97.6°F | Ht 64.0 in | Wt 191.0 lb

## 2022-11-04 DIAGNOSIS — E1169 Type 2 diabetes mellitus with other specified complication: Secondary | ICD-10-CM | POA: Diagnosis not present

## 2022-11-04 DIAGNOSIS — E1159 Type 2 diabetes mellitus with other circulatory complications: Secondary | ICD-10-CM

## 2022-11-04 DIAGNOSIS — E1165 Type 2 diabetes mellitus with hyperglycemia: Secondary | ICD-10-CM | POA: Diagnosis not present

## 2022-11-04 DIAGNOSIS — E785 Hyperlipidemia, unspecified: Secondary | ICD-10-CM

## 2022-11-04 DIAGNOSIS — I152 Hypertension secondary to endocrine disorders: Secondary | ICD-10-CM | POA: Diagnosis not present

## 2022-11-04 MED ORDER — GLIPIZIDE ER 5 MG PO TB24: ORAL_TABLET | ORAL | 3 refills | Status: DC

## 2022-11-04 MED ORDER — ATORVASTATIN CALCIUM 10 MG PO TABS: 10.0000 mg | ORAL_TABLET | Freq: Every day | ORAL | 3 refills | Status: DC

## 2022-11-04 MED ORDER — LOSARTAN POTASSIUM 50 MG PO TABS: 50.0000 mg | ORAL_TABLET | Freq: Every day | ORAL | 3 refills | Status: DC

## 2022-11-04 NOTE — Progress Notes (Signed)
Established patient visit  Patient: Danielle Jackson   DOB: Jun 29, 1941   82 y.o. Female  MRN: [id number] Visit Date: 11/04/2022  Today's healthcare provider: Gwyneth Sprout, FNP  RE Introduced to nurse practitioner role and practice setting.  All questions answered.  Discussed provider/patient relationship and expectations.  Subjective    HPI HPI   6 months follow-up Last edited by Elta Guadeloupe, CMA on 11/04/2022  8:35 AM.      Hyperlipidemia: Patient presents with hyperlipidemia.  Currently on lipitor 10 mg; recommend diet low in saturated fat and regular exercise - 30 min at least 5 times per week. Discussed risk vs benefit of continuing statin given age.  Hypertension: Patient here for follow-up of elevated blood pressure. She is not exercising and is not adherent to low salt diet.  Blood pressure is well controlled at home. Cardiac symptoms none. Patient denies none.  Cardiovascular risk factors: advanced age (older than 49 for men, 60 for women), obesity (BMI >= 30 kg/m2), and sedentary lifestyle. Use of agents associated with hypertension: none. History of target organ damage: none.   Diabetes Mellitus Type II, Follow-up  Lab Results  Component Value Date   HGBA1C 6.7 (H) 05/08/2022   HGBA1C 6.8 (A) 10/29/2021   HGBA1C 6.3 (A) 07/31/2021   Wt Readings from Last 3 Encounters:  11/04/22 191 lb (86.6 kg)  05/08/22 191 lb (86.6 kg)  10/29/21 197 lb 12.8 oz (89.7 kg)   Last seen for diabetes 6 months ago.  Management since then includes glipizide 5 mg XR She reports excellent compliance with treatment. She is not having side effects.  Symptoms: Yes fatigue No foot ulcerations  No appetite changes No nausea  No paresthesia of the feet  No polydipsia  No polyuria No visual disturbances   No vomiting     Home blood sugar records: not being checked  Episodes of hypoglycemia? No    Current insulin regiment: none  Most Recent Eye Exam: HAS AN UPCOMING APPT IN  12/2022  Current exercise: gardening and housecleaning Current diet habits: well balanced  Pertinent Labs: Lab Results  Component Value Date   CHOL 183 05/08/2022   HDL 39 (L) 05/08/2022   LDLCALC 97 05/08/2022   TRIG 276 (H) 05/08/2022   CHOLHDL 4.7 (H) 05/08/2022   Lab Results  Component Value Date   NA 139 05/08/2022   K 4.1 05/08/2022   CREATININE 1.00 05/08/2022   EGFR 57 (L) 05/08/2022   LABMICR 256.2 01/14/2021   MICRALBCREAT 378 (H) 01/14/2021     ---------------------------------------------------------------------------------------------------  Medications: Outpatient Medications Prior to Visit  Medication Sig   CALCIUM CARBONATE-VIT D-MIN PO Take by mouth 2 (two) times daily.   Cholecalciferol (VITAMIN D) 2000 UNITS tablet Take by mouth daily.   fluticasone (FLONASE) 50 MCG/ACT nasal spray Place into the nose daily.   ibuprofen (ADVIL,MOTRIN) 200 MG tablet Take by mouth as needed.   ONETOUCH ULTRA test strip USE ONE STRIP TO CHECK GLUCOSE ONCE DAILY   [DISCONTINUED] atorvastatin (LIPITOR) 10 MG tablet TAKE 1 TABLET BY MOUTH ONCE A DAY   [DISCONTINUED] glipiZIDE (GLUCOTROL XL) 5 MG 24 hr tablet TAKE 1 TABLET BY MOUTH ONCE A DAY   [DISCONTINUED] losartan (COZAAR) 50 MG tablet Take 1 tablet (50 mg total) by mouth daily.   No facility-administered medications prior to visit.    Review of Systems  Last CBC Lab Results  Component Value Date   WBC 6.3 05/08/2022   HGB 13.7 05/08/2022  HCT 39.8 05/08/2022   MCV 96 05/08/2022   MCH 33.2 (H) 05/08/2022   RDW 12.8 05/08/2022   PLT 181 [id number]   Last metabolic panel Lab Results  Component Value Date   GLUCOSE 136 (H) 05/08/2022   NA 139 05/08/2022   K 4.1 05/08/2022   CL 104 05/08/2022   CO2 23 05/08/2022   BUN 13 05/08/2022   CREATININE 1.00 05/08/2022   EGFR 57 (L) 05/08/2022   CALCIUM 9.2 05/08/2022   PHOS 3.6 05/05/2017   PROT 6.4 05/08/2022   ALBUMIN 4.0 05/08/2022   LABGLOB 2.4 05/08/2022    AGRATIO 1.7 05/08/2022   BILITOT 0.6 05/08/2022   ALKPHOS 82 05/08/2022   AST 19 05/08/2022   ALT 17 05/08/2022   ANIONGAP 10 09/03/2018   Last lipids Lab Results  Component Value Date   CHOL 183 05/08/2022   HDL 39 (L) 05/08/2022   LDLCALC 97 05/08/2022   TRIG 276 (H) 05/08/2022   CHOLHDL 4.7 (H) 05/08/2022   Last hemoglobin A1c Lab Results  Component Value Date   HGBA1C 6.7 (H) 05/08/2022   Last thyroid functions Lab Results  Component Value Date   TSH 2.360 05/08/2022       Objective    BP 120/80 Comment: home  Pulse 66   Temp 97.6 F (36.4 C)   Ht '5\' 4"'$  (1.626 m)   Wt 191 lb (86.6 kg)   SpO2 100%   BMI 32.79 kg/m   BP Readings from Last 3 Encounters:  11/04/22 120/80  05/08/22 128/80  10/29/21 130/70   Wt Readings from Last 3 Encounters:  11/04/22 191 lb (86.6 kg)  05/08/22 191 lb (86.6 kg)  10/29/21 197 lb 12.8 oz (89.7 kg)   SpO2 Readings from Last 3 Encounters:  11/04/22 100%  05/08/22 98%  07/31/21 100%      Physical Exam Vitals and nursing note reviewed.  Constitutional:      General: She is not in acute distress.    Appearance: Normal appearance. She is obese. She is not ill-appearing, toxic-appearing or diaphoretic.  HENT:     Head: Normocephalic and atraumatic.  Cardiovascular:     Rate and Rhythm: Normal rate and regular rhythm.     Pulses: Normal pulses.     Heart sounds: Normal heart sounds. No murmur heard.    No friction rub. No gallop.  Pulmonary:     Effort: Pulmonary effort is normal. No respiratory distress.     Breath sounds: Normal breath sounds. No stridor. No wheezing, rhonchi or rales.  Chest:     Chest wall: No tenderness.  Abdominal:     General: Bowel sounds are normal.     Palpations: Abdomen is soft.  Musculoskeletal:        General: No swelling, tenderness, deformity or signs of injury. Normal range of motion.     Right lower leg: No edema.     Left lower leg: No edema.  Skin:    General: Skin is warm and  dry.     Capillary Refill: Capillary refill takes less than 2 seconds.     Coloration: Skin is not jaundiced or pale.     Findings: No bruising, erythema, lesion or rash.  Neurological:     General: No focal deficit present.     Mental Status: She is alert and oriented to person, place, and time. Mental status is at baseline.     Cranial Nerves: No cranial nerve deficit.     Sensory: No  sensory deficit.     Motor: No weakness.     Coordination: Coordination normal.  Psychiatric:        Mood and Affect: Mood normal.        Behavior: Behavior normal.        Thought Content: Thought content normal.        Judgment: Judgment normal.     No results found for any visits on 11/04/22.  Assessment & Plan     Problem List Items Addressed This Visit       Cardiovascular and Mediastinum   Hypertension associated with diabetes ([place])   Relevant Medications   losartan (COZAAR) 50 MG tablet   glipiZIDE (GLUCOTROL XL) 5 MG 24 hr tablet   atorvastatin (LIPITOR) 10 MG tablet   Other Relevant Orders   CBC   Comprehensive Metabolic Panel (CMET)     Endocrine   Hyperlipidemia associated with type 2 diabetes mellitus (HCC) - Primary   Relevant Medications   losartan (COZAAR) 50 MG tablet   glipiZIDE (GLUCOTROL XL) 5 MG 24 hr tablet   atorvastatin (LIPITOR) 10 MG tablet   Other Relevant Orders   Lipid panel   Type 2 diabetes mellitus with hyperglycemia, without long-term current use of insulin (HCC)   Relevant Medications   losartan (COZAAR) 50 MG tablet   glipiZIDE (GLUCOTROL XL) 5 MG 24 hr tablet   atorvastatin (LIPITOR) 10 MG tablet   Other Relevant Orders   Hemoglobin A1c   Urine Microalbumin w/creat. ratio   Return in about 6 months (around 05/07/2023) for chonic disease management.     Vonna Kotyk, FNP, have reviewed all documentation for this visit. The documentation on 11/04/22 for the exam, diagnosis, procedures, and orders are all accurate and complete.  Gwyneth Sprout,  [place] [phone] (phone) [phone] (fax)  [place]

## 2022-11-04 NOTE — Patient Instructions (Signed)
The CDC recommends two doses of Shingrix (the new shingles vaccine) separated by 2 to 6 months for adults age 81 years and older. I recommend checking with your insurance plan regarding coverage for this vaccine.    

## 2022-11-05 ENCOUNTER — Other Ambulatory Visit: Payer: Self-pay | Admitting: Family Medicine

## 2022-11-05 LAB — CBC
Hematocrit: 38 % (ref 34.0–46.6)
Hemoglobin: 13.1 g/dL (ref 11.1–15.9)
MCH: 33.6 pg — ABNORMAL HIGH (ref 26.6–33.0)
MCHC: 34.5 g/dL (ref 31.5–35.7)
MCV: 97 fL (ref 79–97)
Platelets: 179 10*3/uL (ref 150–450)
RBC: 3.9 x10E6/uL (ref 3.77–5.28)
RDW: 12.4 % (ref 11.7–15.4)
WBC: 6 10*3/uL (ref 3.4–10.8)

## 2022-11-05 LAB — COMPREHENSIVE METABOLIC PANEL
ALT: 14 IU/L (ref 0–32)
AST: 16 IU/L (ref 0–40)
Albumin/Globulin Ratio: 1.7 (ref 1.2–2.2)
Albumin: 4 g/dL (ref 3.8–4.8)
Alkaline Phosphatase: 80 IU/L (ref 44–121)
BUN/Creatinine Ratio: 27 (ref 12–28)
BUN: 28 mg/dL — ABNORMAL HIGH (ref 8–27)
Bilirubin Total: 0.5 mg/dL (ref 0.0–1.2)
CO2: 22 mmol/L (ref 20–29)
Calcium: 9.4 mg/dL (ref 8.7–10.3)
Chloride: 104 mmol/L (ref 96–106)
Creatinine, Ser: 1.02 mg/dL — ABNORMAL HIGH (ref 0.57–1.00)
Globulin, Total: 2.4 g/dL (ref 1.5–4.5)
Glucose: 195 mg/dL — ABNORMAL HIGH (ref 70–99)
Potassium: 4.3 mmol/L (ref 3.5–5.2)
Sodium: 141 mmol/L (ref 134–144)
Total Protein: 6.4 g/dL (ref 6.0–8.5)
eGFR: 56 mL/min/{1.73_m2} — ABNORMAL LOW (ref >59)

## 2022-11-05 LAB — LIPID PANEL
Chol/HDL Ratio: 3.3 ratio (ref 0.0–4.4)
Cholesterol, Total: 137 mg/dL (ref 100–199)
HDL: 42 mg/dL (ref >39)
LDL Chol Calc (NIH): 55 mg/dL (ref 0–99)
Triglycerides: 254 mg/dL — ABNORMAL HIGH (ref 0–149)
VLDL Cholesterol Cal: 40 mg/dL (ref 5–40)

## 2022-11-05 LAB — MICROALBUMIN / CREATININE URINE RATIO
Creatinine, Urine: 105.9 mg/dL
Microalb/Creat Ratio: 402 mg/g creat — ABNORMAL HIGH (ref 0–29)
Microalbumin, Urine: 425.5 ug/mL

## 2022-11-05 LAB — HEMOGLOBIN A1C
Est. average glucose Bld gHb Est-mCnc: 146 mg/dL
Hgb A1c MFr Bld: 6.7 % — ABNORMAL HIGH (ref 4.8–5.6)

## 2022-11-05 NOTE — Progress Notes (Signed)
Urine micro is high; would like to do kidney US if pt will consent

## 2022-11-05 NOTE — Progress Notes (Signed)
All labs are normal and stable; urine micro is pending. Continue to recommend balanced, lower carb meals. Smaller meal size, adding snacks. Choosing water as drink of choice and increasing purposeful exercise.

## 2022-11-06 ENCOUNTER — Other Ambulatory Visit: Payer: Self-pay | Admitting: Family Medicine

## 2022-11-06 DIAGNOSIS — R809 Proteinuria, unspecified: Secondary | ICD-10-CM

## 2022-11-11 ENCOUNTER — Ambulatory Visit
Admission: RE | Admit: 2022-11-11 | Discharge: 2022-11-11 | Disposition: A | Discharge status: Home / Self Care | Payer: PPO | Source: Ambulatory Visit | Attending: Family Medicine | Admitting: Family Medicine

## 2022-11-11 DIAGNOSIS — R809 Proteinuria, unspecified: Secondary | ICD-10-CM | POA: Diagnosis not present

## 2022-11-11 DIAGNOSIS — R8290 Unspecified abnormal findings in urine: Secondary | ICD-10-CM | POA: Diagnosis not present

## 2022-11-17 ENCOUNTER — Telehealth: Payer: Self-pay

## 2022-11-17 ENCOUNTER — Other Ambulatory Visit: Payer: Self-pay

## 2022-11-17 DIAGNOSIS — R809 Proteinuria, unspecified: Secondary | ICD-10-CM

## 2022-11-17 NOTE — Telephone Encounter (Signed)
Copied from [place] [id number]. Topic: General - Other >> Nov 17, 2022  3:41 PM Carrielelia G wrote: Reason for CRM: attention Sarah,pt stated there is no need to call back, she has the correct doctor Lanora Manis, [place])

## 2022-11-17 NOTE — Telephone Encounter (Signed)
Copied from [place]. Topic: General - Other >> Nov 14, 2022  1:56 PM Oley Balm E wrote: Reason for CRM: Pt called to discuss her Ultrasound results, please advise.Pt wants to discuss this today [phone]

## 2022-11-17 NOTE — Progress Notes (Signed)
Renal, kidney ultrasound notes confirmed renal/kidney disease. Continue to recommend consult with kidney doctor, nephrologist.

## 2022-11-19 DIAGNOSIS — R809 Proteinuria, unspecified: Secondary | ICD-10-CM | POA: Diagnosis not present

## 2022-11-19 DIAGNOSIS — E1122 Type 2 diabetes mellitus with diabetic chronic kidney disease: Secondary | ICD-10-CM | POA: Diagnosis not present

## 2022-11-19 DIAGNOSIS — E785 Hyperlipidemia, unspecified: Secondary | ICD-10-CM | POA: Diagnosis not present

## 2022-11-19 DIAGNOSIS — R8290 Unspecified abnormal findings in urine: Secondary | ICD-10-CM | POA: Diagnosis not present

## 2022-11-19 DIAGNOSIS — I10 Essential (primary) hypertension: Secondary | ICD-10-CM | POA: Diagnosis not present

## 2022-11-19 DIAGNOSIS — N1831 Chronic kidney disease, stage 3a: Secondary | ICD-10-CM | POA: Diagnosis not present

## 2022-11-26 DIAGNOSIS — E119 Type 2 diabetes mellitus without complications: Secondary | ICD-10-CM | POA: Diagnosis not present

## 2022-12-19 ENCOUNTER — Ambulatory Visit (INDEPENDENT_AMBULATORY_CARE_PROVIDER_SITE_OTHER): Payer: PPO | Admitting: Family Medicine

## 2022-12-19 ENCOUNTER — Encounter: Payer: Self-pay | Admitting: Family Medicine

## 2022-12-19 VITALS — BP 132/82 | HR 66 | Temp 97.6°F | Wt 190.0 lb

## 2022-12-19 DIAGNOSIS — N3091 Cystitis, unspecified with hematuria: Secondary | ICD-10-CM | POA: Diagnosis not present

## 2022-12-19 DIAGNOSIS — R311 Benign essential microscopic hematuria: Secondary | ICD-10-CM

## 2022-12-19 LAB — POCT URINALYSIS DIPSTICK
Bilirubin, UA: NEGATIVE
Glucose, UA: NEGATIVE
Nitrite, UA: NEGATIVE
Protein, UA: POSITIVE — AB
Spec Grav, UA: 1.02 (ref 1.010–1.025)
Urobilinogen, UA: 0.2 E.U./dL
pH, UA: 6 (ref 5.0–8.0)

## 2022-12-19 MED ORDER — SULFAMETHOXAZOLE-TRIMETHOPRIM 800-160 MG PO TABS: 1.0000 | ORAL_TABLET | Freq: Two times a day (BID) | ORAL | 0 refills | Status: AC

## 2022-12-19 NOTE — Progress Notes (Signed)
I,Sulibeya S Dimas,acting as a Neurosurgeon for Shirlee Latch, MD.,have documented all relevant documentation on the behalf of Shirlee Latch, MD,as directed by  Shirlee Latch, MD while in the presence of Shirlee Latch, MD.     Established patient visit   Patient: Danielle Jackson   DOB: [birth year]   82 y.o. Female  MRN: [id number] Visit Date: 12/19/2022  Today's healthcare provider: Shirlee Latch, MD   Chief Complaint  Patient presents with   Urinary Tract Infection   Subjective    HPI  Urinary symptoms  She reports new onset dysuria, flank pain, and urinary frequency. The current episode started  1 weeks ago and is worsening. Patient states symptoms are severe in intensity, occurring constantly. She  has not been recently treated for similar symptoms.    Associated symptoms: No abdominal pain No back pain  Yes chills No constipation  Yes cramping Yes diarrhea  No discharge No fever  No hematuria Yes nausea  No vomiting    --------------------------------------------------------------------------------------- Had one episode of diarrhea 1 wk ago, but better since  Abd pain suprapubically  Tried d-mannose and drinking water  Medications: Outpatient Medications Prior to Visit  Medication Sig   atorvastatin (LIPITOR) 10 MG tablet Take 1 tablet (10 mg total) by mouth daily.   Azelastine HCl (ASTEPRO) 0.15 % SOLN Place into the nose.   CALCIUM CARBONATE-VIT D-MIN PO Take by mouth 2 (two) times daily.   Cholecalciferol (VITAMIN D) 2000 UNITS tablet Take by mouth daily.   glipiZIDE (GLUCOTROL XL) 5 MG 24 hr tablet TAKE 1 TABLET BY MOUTH ONCE A DAY   ibuprofen (ADVIL,MOTRIN) 200 MG tablet Take by mouth as needed.   losartan (COZAAR) 50 MG tablet Take 1 tablet (50 mg total) by mouth daily.   ONETOUCH ULTRA test strip USE ONE STRIP TO CHECK GLUCOSE ONCE DAILY   [DISCONTINUED] fluticasone (FLONASE) 50 MCG/ACT nasal spray Place into the nose daily.   No  facility-administered medications prior to visit.    Review of Systems  Constitutional:  Positive for chills. Negative for fever.  Respiratory:  Negative for cough, chest tightness and shortness of breath.   Gastrointestinal:  Positive for abdominal pain, diarrhea and nausea. Negative for constipation and vomiting.  Genitourinary:  Positive for dysuria, flank pain, frequency and pelvic pain. Negative for hematuria, vaginal bleeding and vaginal discharge.       Objective    BP 132/82 Comment: home reading  Pulse 66   Temp 97.6 F (36.4 C) (Temporal)   Wt 190 lb (86.2 kg)   BMI 32.61 kg/m  BP Readings from Last 3 Encounters:  12/19/22 132/82  11/04/22 120/80  05/08/22 128/80   Wt Readings from Last 3 Encounters:  12/19/22 190 lb (86.2 kg)  11/04/22 191 lb (86.6 kg)  05/08/22 191 lb (86.6 kg)      Physical Exam Vitals reviewed.  Constitutional:      General: She is not in acute distress.    Appearance: She is well-developed.  HENT:     Head: Normocephalic and atraumatic.  Eyes:     General: No scleral icterus.    Conjunctiva/sclera: Conjunctivae normal.  Cardiovascular:     Rate and Rhythm: Normal rate and regular rhythm.     Heart sounds: Normal heart sounds. No murmur heard. Pulmonary:     Effort: Pulmonary effort is normal. No respiratory distress.     Breath sounds: Normal breath sounds. No wheezing or rales.  Abdominal:     General: There is  no distension.     Palpations: Abdomen is soft.     Tenderness: There is abdominal tenderness (suprapubic) in the suprapubic area. There is no guarding or rebound.  Skin:    General: Skin is warm and dry.     Capillary Refill: Capillary refill takes less than 2 seconds.     Findings: No rash.  Neurological:     Mental Status: She is alert and oriented to person, place, and time.  Psychiatric:        Behavior: Behavior normal.       Results for orders placed or performed in visit on 12/19/22  POCT urinalysis  dipstick  Result Value Ref Range   Color, UA yellow    Clarity, UA clear    Glucose, UA Negative Negative   Bilirubin, UA Negative    Ketones, UA postive    Spec Grav, UA 1.020 1.010 - 1.025   Blood, UA small    pH, UA 6.0 5.0 - 8.0   Protein, UA Positive (A) Negative   Urobilinogen, UA 0.2 0.2 or 1.0 E.U./dL   Nitrite, UA Negative    Leukocytes, UA Moderate (2+) (A) Negative   Appearance     Odor      Assessment & Plan     Problem List Items Addressed This Visit   None Visit Diagnoses     Cystitis with hematuria    -  Primary   Relevant Orders   POCT urinalysis dipstick (Completed)   Urine Culture   Benign essential microscopic hematuria          - Symptoms and UA consistent with UTI -No systemic symptoms or signs of pyelonephritis - Has chronic microscopic hematuria - no change, has had urology workup previously per patient and told it was benign -Will start treatment with 5day course of Bactrim after reviewing previous urine culture  -We will send urine culture to confirm sensitivities -Discussed return precautions   Return if symptoms worsen or fail to improve.      I, Shirlee Latch, MD, have reviewed all documentation for this visit. The documentation on 12/19/22 for the exam, diagnosis, procedures, and orders are all accurate and complete.   , Marzella Schlein, MD, MPH [facility]

## 2022-12-21 LAB — URINE CULTURE

## 2022-12-23 NOTE — Progress Notes (Signed)
NEGATIVE for UTI

## 2022-12-24 ENCOUNTER — Ambulatory Visit (INDEPENDENT_AMBULATORY_CARE_PROVIDER_SITE_OTHER): Payer: PPO

## 2022-12-24 VITALS — Ht 64.0 in | Wt 190.0 lb

## 2022-12-24 DIAGNOSIS — Z0000 Encounter for general adult medical examination without abnormal findings: Secondary | ICD-10-CM | POA: Diagnosis not present

## 2022-12-24 NOTE — Progress Notes (Addendum)
I connected with  Danielle Jackson on 12/24/22 by a audio enabled telemedicine application and verified that I am speaking with the correct person using two identifiers.  Patient Location: Home  Provider Location: Office/Clinic  I discussed the limitations of evaluation and management by telemedicine. The patient expressed understanding and agreed to proceed.  Subjective:   Danielle Jackson is a 82 y.o. female who presents for Medicare Annual (Subsequent) preventive examination.  Review of Systems    Cardiac Risk Factors include: advanced age (>44men, >67 women);diabetes mellitus;dyslipidemia;hypertension;obesity (BMI >30kg/m2)    Objective:    Today's Vitals   12/24/22 0952  Weight: 190 lb (86.2 kg)  Height: 5\' 4"  (1.626 m)   Body mass index is 32.61 kg/m.     12/24/2022   10:01 AM 10/09/2021    3:12 PM 01/23/2020    3:22 PM 07/25/2019    9:31 AM 09/03/2018    4:23 PM 03/07/2015    8:19 AM  Advanced Directives  Does Patient Have a Medical Advance Directive? Yes No No No No Yes  Type of Advance Directive      Healthcare Power of Attorney  Does patient want to make changes to medical advance directive?      No - Patient declined  Copy of Healthcare Power of Attorney in Chart?   No - copy requested     Would patient like information on creating a medical advance directive?  No - Patient declined  No - Patient declined No - Patient declined     Current Medications (verified) Outpatient Encounter Medications as of 12/24/2022  Medication Sig   atorvastatin (LIPITOR) 10 MG tablet Take 1 tablet (10 mg total) by mouth daily.   Azelastine HCl (ASTEPRO) 0.15 % SOLN Place into the nose.   CALCIUM CARBONATE-VIT D-MIN PO Take by mouth 2 (two) times daily.   Cholecalciferol (VITAMIN D) 2000 UNITS tablet Take by mouth daily.   glipiZIDE (GLUCOTROL XL) 5 MG 24 hr tablet TAKE 1 TABLET BY MOUTH ONCE A DAY   ibuprofen (ADVIL,MOTRIN) 200 MG tablet Take by mouth as needed.   losartan (COZAAR) 50 MG  tablet Take 1 tablet (50 mg total) by mouth daily.   ONETOUCH ULTRA test strip USE ONE STRIP TO CHECK GLUCOSE ONCE DAILY   sulfamethoxazole-trimethoprim (BACTRIM DS) 800-160 MG tablet Take 1 tablet by mouth 2 (two) times daily for 5 days. (Patient not taking: Reported on 12/24/2022)   No facility-administered encounter medications on file as of 12/24/2022.    Allergies (verified) Amoxicillin, Cefprozil, Hydrochlorothiazide, Lovastatin, Medrol  [methylprednisolone], Metformin hcl, Metronidazole, Nitrofurantoin monohyd macro, Pravastatin sodium, Tetracycline, and Clarithromycin   History: Past Medical History:  Diagnosis Date   Diabetes mellitus without complication    Hyperlipidemia    Hypertension    Past Surgical History:  Procedure Laterality Date   ABDOMINAL HYSTERECTOMY  1975   CATARACT EXTRACTION  03/01/2001   KNEE SURGERY  03/24/2014   Family History  Problem Relation Age of Onset   Diabetes Mother    Heart disease Mother    Hyperlipidemia Mother    Hypertension Mother    Arthritis Mother    Mental illness Mother    Asthma Father    Cancer Father    Hypertension Sister    Asthma Brother    Breast cancer Neg Hx    Social History   Socioeconomic History   Marital status: Married    Spouse name: Not on file   Number of children: 2   Years  of education: Not on file   Highest education level: High school graduate  Occupational History   Not on file  Tobacco Use   Smoking status: Never   Smokeless tobacco: Never  Substance and Sexual Activity   Alcohol use: No    Alcohol/week: 0.0 standard drinks of alcohol   Drug use: No   Sexual activity: Not on file  Other Topics Concern   Not on file  Social History Narrative   Not on file   Social Determinants of Health   Financial Resource Strain: Low Risk  (12/24/2022)   Overall Financial Resource Strain (CARDIA)    Difficulty of Paying Living Expenses: Not hard at all  Food Insecurity: No Food Insecurity (12/24/2022)    Hunger Vital Sign    Worried About Running Out of Food in the Last Year: Never true    Ran Out of Food in the Last Year: Never true  Transportation Needs: No Transportation Needs (12/24/2022)   PRAPARE - Administrator, Civil Service (Medical): No    Lack of Transportation (Non-Medical): No  Physical Activity: Insufficiently Active (12/24/2022)   Exercise Vital Sign    Days of Exercise per Week: 3 days    Minutes of Exercise per Session: 30 min  Stress: No Stress Concern Present (12/24/2022)   [employer] of Occupational Health - Occupational Stress Questionnaire    Feeling of Stress : Not at all  Social Connections: Moderately Integrated (12/24/2022)   Social Connection and Isolation Panel [NHANES]    Frequency of Communication with Friends and Family: More than three times a week    Frequency of Social Gatherings with Friends and Family: More than three times a week    Attends Religious Services: 1 to 4 times per year    Active Member of [employer] or Organizations: No    Attends Engineer, structural: Never    Marital Status: Married    Tobacco Counseling Counseling given: Not Answered   Clinical Intake:  Pre-visit preparation completed: Yes  Pain : No/denies pain   BMI - recorded: 32.61 Nutritional Status: BMI > 30  Obese Nutritional Risks: None Diabetes: Yes CBG done?: Yes (110 at home this morning) CBG resulted in Enter/ Edit results?: No Did pt. bring in CBG monitor from home?: No  How often do you need to have someone help you when you read instructions, pamphlets, or other written materials from your doctor or pharmacy?: 1 - Never  Diabetic?yes  Interpreter Needed?: No  Comments: lives with husband Information entered by :: B.,LPN   Activities of Daily Living    12/24/2022   10:02 AM 12/19/2022    9:18 AM  In your present state of health, do you have any difficulty performing the following activities:  Hearing? 0 0  Vision?  0 0  Difficulty concentrating or making decisions? 0 0  Walking or climbing stairs? 1 1  Dressing or bathing? 0 0  Doing errands, shopping? 0 0  Preparing Food and eating ? N   Using the Toilet? N   In the past six months, have you accidently leaked urine? Y   Do you have problems with loss of bowel control? N   Managing your Medications? N   Managing your Finances? N   Housekeeping or managing your Housekeeping? N     Patient Care Team: Jacky Kindle, FNP as PCP - General (Family Medicine) Thurnell Garbe, OD (Optometry)  Indicate any recent Medical Services you may have received from  other than [facility] providers in the past year (date may be approximate).     Assessment:   This is a routine wellness examination for Danielle Jackson.  Hearing/Vision screen Hearing Screening - Comments:: Adequate hearing Vision Screening - Comments:: Adequate vision w/glasses Dr Marti Sleigh   Dietary issues and exercise activities discussed: Current Exercise Habits: Home exercise routine, Type of exercise: walking, Time (Minutes): 30, Frequency (Times/Week): 3, Weekly Exercise (Minutes/Week): 90, Intensity: Mild, Exercise limited by: orthopedic condition(s)   Goals Addressed             This Visit's Progress    DIET - EAT MORE FRUITS AND VEGETABLES   On track    DIET - INCREASE WATER INTAKE   On track    Recommend to drink at least 6-8 8oz glasses of water per day.       Depression Screen    12/24/2022    9:56 AM 12/19/2022    9:18 AM 11/04/2022    8:39 AM 05/08/2022    9:31 AM 10/29/2021   10:06 AM 10/09/2021    3:10 PM 04/26/2021   11:20 AM  PHQ 2/9 Scores  PHQ - 2 Score 0 0 0 0 0 0 0  PHQ- 9 Score  0 0 0   0    Fall Risk    12/24/2022    9:55 AM 12/19/2022    9:18 AM 11/04/2022    8:39 AM 05/08/2022    9:31 AM 10/09/2021    3:13 PM  Fall Risk   Falls in the past year? 0 0 0 0 0  Number falls in past yr: 0 0 0 0 0  Injury with Fall? 0 0 0 0 0  Risk for fall due to : No Fall Risks No Fall Risks   No Fall Risks No Fall Risks  Follow up Education provided;Falls prevention discussed Falls evaluation completed  Falls evaluation completed Falls evaluation completed    FALL RISK PREVENTION PERTAINING TO THE HOME:  Any stairs in or around the home? Yes  If so, are there any without handrails? Yes  Home free of loose throw rugs in walkways, pet beds, electrical cords, etc? Yes  Adequate lighting in your home to reduce risk of falls? Yes   ASSISTIVE DEVICES UTILIZED TO PREVENT FALLS:  Life alert? No  Use of a cane, walker or w/c? No  rt knee brace Grab bars in the bathroom? Yes  Shower chair or bench in shower? Yes  Elevated toilet seat or a handicapped toilet? Yes   Cognitive Function:        12/24/2022   10:03 AM  6CIT Screen  What Year? 0 points  What month? 0 points  What time? 0 points  Count back from 20 0 points  Months in reverse 0 points  Repeat phrase 0 points  Total Score 0 points    Immunizations Immunization History  Administered Date(s) Administered   Fluad Quad(high Dose 65+) 07/01/2022   Influenza Split 06/25/2010, 05/20/2011   Influenza, High Dose Seasonal PF 06/06/2014, 06/08/2015, 06/03/2016, 06/24/2017, 07/02/2019   Influenza,inj,Quad PF,6+ Mos 05/03/2013   Influenza-Unspecified 06/01/2014, 06/03/2016, 07/01/2018, 07/01/2021   Pneumococcal Conjugate-13 06/06/2014   Pneumococcal Polysaccharide-23 07/03/2007   Td 08/02/2004   Tdap 05/20/2011   Zoster, Live 01/04/2013    TDAP status: Up to date  Flu Vaccine status: Completed at today's visit  Pneumococcal vaccine status: Up to date  Covid-19 vaccine status: Declined, Education has been provided regarding the importance of this vaccine but patient  still declined. Advised may receive this vaccine at local pharmacy or Health Dept.or vaccine clinic. Aware to provide a copy of the vaccination record if obtained from local pharmacy or Health Dept. Verbalized acceptance and understanding.  Qualifies  for Shingles Vaccine? Yes   Zostavax completed Yes  #1 only Shingrix Completed?: No.    Education has been provided regarding the importance of this vaccine. Patient has been advised to call insurance company to determine out of pocket expense if they have not yet received this vaccine. Advised may also receive vaccine at local pharmacy or Health Dept. Verbalized acceptance and understanding.  Screening Tests Health Maintenance  Topic Date Due   Zoster Vaccines- Shingrix (1 of 2) Never done   DTaP/Tdap/Td (3 - Td or Tdap) 05/19/2021   DEXA SCAN  06/15/2022   OPHTHALMOLOGY EXAM  07/01/2022   INFLUENZA VACCINE  04/02/2023   HEMOGLOBIN A1C  05/07/2023   Diabetic kidney evaluation - eGFR measurement  11/04/2023   Diabetic kidney evaluation - Urine ACR  11/04/2023   FOOT EXAM  11/04/2023   Medicare Annual Wellness (AWV)  12/24/2023   Pneumonia Vaccine 1+ Years old  Completed   HPV VACCINES  Aged Out   COVID-19 Vaccine  Discontinued    Health Maintenance  Health Maintenance Due  Topic Date Due   Zoster Vaccines- Shingrix (1 of 2) Never done   DTaP/Tdap/Td (3 - Td or Tdap) 05/19/2021   DEXA SCAN  06/15/2022   OPHTHALMOLOGY EXAM  07/01/2022    Colorectal cancer screening: No longer required.   Mammogram status: No longer required due to age.  Bone Density status: Completed yes. Results reflect: Bone density results: OSTEOPENIA. Repeat every 5 years.  Lung Cancer Screening: (Low Dose CT Chest recommended if Age 52-80 years, 30 pack-year currently smoking OR have quit w/in 15years.) does not qualify.   Lung Cancer Screening Referral: no  Additional Screening:  Hepatitis C Screening: does not qualify; Completed yes  Vision Screening: Recommended annual ophthalmology exams for early detection of glaucoma and other disorders of the eye. Is the patient up to date with their annual eye exam?  Yes  Who is the provider or what is the name of the office in which the patient attends  annual eye exams? Dr Marti Sleigh If pt is not established with a provider, would they like to be referred to a provider to establish care? No .   Dental Screening: Recommended annual dental exams for proper oral hygiene  Community Resource Referral / Chronic Care Management: CRR required this visit?  No   CCM required this visit?  No     Plan:     I have personally reviewed and noted the following in the patient's chart:   Medical and social history Use of alcohol, tobacco or illicit drugs  Current medications and supplements including opioid prescriptions. Patient is not currently taking opioid prescriptions. Functional ability and status Nutritional status Physical activity Advanced directives List of other physicians Hospitalizations, surgeries, and ER visits in previous 12 months Vitals Screenings to include cognitive, depression, and falls Referrals and appointments  In addition, I have reviewed and discussed with patient certain preventive protocols, quality metrics, and best practice recommendations. A written personalized care plan for preventive services as well as general preventive health recommendations were provided to patient.     GELENA KLOSINSKI, LPN   1/61/0960   Nurse Notes: Pt is doing well. States she is caring for her husband who just had eye surgery. She has  no concerns or questions at this time.

## 2022-12-24 NOTE — Patient Instructions (Signed)
Danielle Jackson , Thank you for taking time to come for your Medicare Wellness Visit. I appreciate your ongoing commitment to your health goals. Please review the following plan we discussed and let me know if I can assist you in the future.   These are the goals we discussed:  Goals      DIET - EAT MORE FRUITS AND VEGETABLES     DIET - INCREASE WATER INTAKE     Recommend to drink at least 6-8 8oz glasses of water per day.        This is a list of the screening recommended for you and due dates:  Health Maintenance  Topic Date Due   Zoster (Shingles) Vaccine (1 of 2) Never done   DTaP/Tdap/Td vaccine (3 - Td or Tdap) 05/19/2021   DEXA scan (bone density measurement)  06/15/2022   Eye exam for diabetics  07/01/2022   Flu Shot  04/02/2023   Hemoglobin A1C  05/07/2023   Yearly kidney function blood test for diabetes  11/04/2023   Yearly kidney health urinalysis for diabetes  11/04/2023   Complete foot exam   11/04/2023   Medicare Annual Wellness Visit  12/24/2023   Pneumonia Vaccine  Completed   HPV Vaccine  Aged Out   COVID-19 Vaccine  Discontinued    Advanced directives: yes  Conditions/risks identified: none  Next appointment: Follow up in one year for your annual wellness visit 12/29/2023 @ 9:45am telephone   Preventive Care 65 Years and Older, Female Preventive care refers to lifestyle choices and visits with your health care provider that can promote health and wellness. What does preventive care include? A yearly physical exam. This is also called an annual well check. Dental exams once or twice a year. Routine eye exams. Ask your health care provider how often you should have your eyes checked. Personal lifestyle choices, including: Daily care of your teeth and gums. Regular physical activity. Eating a healthy diet. Avoiding tobacco and drug use. Limiting alcohol use. Practicing safe sex. Taking low-dose aspirin every day. Taking vitamin and mineral supplements as  recommended by your health care provider. What happens during an annual well check? The services and screenings done by your health care provider during your annual well check will depend on your age, overall health, lifestyle risk factors, and family history of disease. Counseling  Your health care provider may ask you questions about your: Alcohol use. Tobacco use. Drug use. Emotional well-being. Home and relationship well-being. Sexual activity. Eating habits. History of falls. Memory and ability to understand (cognition). Work and work Astronomer. Reproductive health. Screening  You may have the following tests or measurements: Height, weight, and BMI. Blood pressure. Lipid and cholesterol levels. These may be checked every 5 years, or more frequently if you are over 21 years old. Skin check. Lung cancer screening. You may have this screening every year starting at age 53 if you have a 30-pack-year history of smoking and currently smoke or have quit within the past 15 years. Fecal occult blood test (FOBT) of the stool. You may have this test every year starting at age 56. Flexible sigmoidoscopy or colonoscopy. You may have a sigmoidoscopy every 5 years or a colonoscopy every 10 years starting at age 9. Hepatitis C blood test. Hepatitis B blood test. Sexually transmitted disease (STD) testing. Diabetes screening. This is done by checking your blood sugar (glucose) after you have not eaten for a while (fasting). You may have this done every 1-3 years. Bone density  scan. This is done to screen for osteoporosis. You may have this done starting at age 61. Mammogram. This may be done every 1-2 years. Talk to your health care provider about how often you should have regular mammograms. Talk with your health care provider about your test results, treatment options, and if necessary, the need for more tests. Vaccines  Your health care provider may recommend certain vaccines, such  as: Influenza vaccine. This is recommended every year. Tetanus, diphtheria, and acellular pertussis (Tdap, Td) vaccine. You may need a Td booster every 10 years. Zoster vaccine. You may need this after age 2. Pneumococcal 13-valent conjugate (PCV13) vaccine. One dose is recommended after age 9. Pneumococcal polysaccharide (PPSV23) vaccine. One dose is recommended after age 22. Talk to your health care provider about which screenings and vaccines you need and how often you need them. This information is not intended to replace advice given to you by your health care provider. Make sure you discuss any questions you have with your health care provider. Document Released: 09/14/2015 Document Revised: 05/07/2016 Document Reviewed: 06/19/2015 Elsevier Interactive Patient Education  2017 [employer] Prevention in the Home Falls can cause injuries. They can happen to people of all ages. There are many things you can do to make your home safe and to help prevent falls. What can I do on the outside of my home? Regularly fix the edges of walkways and driveways and fix any cracks. Remove anything that might make you trip as you walk through a door, such as a raised step or threshold. Trim any bushes or trees on the path to your home. Use bright outdoor lighting. Clear any walking paths of anything that might make someone trip, such as rocks or tools. Regularly check to see if handrails are loose or broken. Make sure that both sides of any steps have handrails. Any raised decks and porches should have guardrails on the edges. Have any leaves, snow, or ice cleared regularly. Use sand or salt on walking paths during winter. Clean up any spills in your garage right away. This includes oil or grease spills. What can I do in the bathroom? Use night lights. Install grab bars by the toilet and in the tub and shower. Do not use towel bars as grab bars. Use non-skid mats or decals in the tub or  shower. If you need to sit down in the shower, use a plastic, non-slip stool. Keep the floor dry. Clean up any water that spills on the floor as soon as it happens. Remove soap buildup in the tub or shower regularly. Attach bath mats securely with double-sided non-slip rug tape. Do not have throw rugs and other things on the floor that can make you trip. What can I do in the bedroom? Use night lights. Make sure that you have a light by your bed that is easy to reach. Do not use any sheets or blankets that are too big for your bed. They should not hang down onto the floor. Have a firm chair that has side arms. You can use this for support while you get dressed. Do not have throw rugs and other things on the floor that can make you trip. What can I do in the kitchen? Clean up any spills right away. Avoid walking on wet floors. Keep items that you use a lot in easy-to-reach places. If you need to reach something above you, use a strong step stool that has a grab bar. Keep electrical  cords out of the way. Do not use floor polish or wax that makes floors slippery. If you must use wax, use non-skid floor wax. Do not have throw rugs and other things on the floor that can make you trip. What can I do with my stairs? Do not leave any items on the stairs. Make sure that there are handrails on both sides of the stairs and use them. Fix handrails that are broken or loose. Make sure that handrails are as long as the stairways. Check any carpeting to make sure that it is firmly attached to the stairs. Fix any carpet that is loose or worn. Avoid having throw rugs at the top or bottom of the stairs. If you do have throw rugs, attach them to the floor with carpet tape. Make sure that you have a light switch at the top of the stairs and the bottom of the stairs. If you do not have them, ask someone to add them for you. What else can I do to help prevent falls? Wear shoes that: Do not have high heels. Have  rubber bottoms. Are comfortable and fit you well. Are closed at the toe. Do not wear sandals. If you use a stepladder: Make sure that it is fully opened. Do not climb a closed stepladder. Make sure that both sides of the stepladder are locked into place. Ask someone to hold it for you, if possible. Clearly mark and make sure that you can see: Any grab bars or handrails. First and last steps. Where the edge of each step is. Use tools that help you move around (mobility aids) if they are needed. These include: Canes. Walkers. Scooters. Crutches. Turn on the lights when you go into a dark area. Replace any light bulbs as soon as they burn out. Set up your furniture so you have a clear path. Avoid moving your furniture around. If any of your floors are uneven, fix them. If there are any pets around you, be aware of where they are. Review your medicines with your doctor. Some medicines can make you feel dizzy. This can increase your chance of falling. Ask your doctor what other things that you can do to help prevent falls. This information is not intended to replace advice given to you by your health care provider. Make sure you discuss any questions you have with your health care provider. Document Released: 06/14/2009 Document Revised: 01/24/2016 Document Reviewed: 09/22/2014 Elsevier Interactive Patient Education  2017 [employer].

## 2023-05-05 ENCOUNTER — Ambulatory Visit (INDEPENDENT_AMBULATORY_CARE_PROVIDER_SITE_OTHER): Payer: PPO | Admitting: Family Medicine

## 2023-05-05 ENCOUNTER — Encounter: Payer: Self-pay | Admitting: Family Medicine

## 2023-05-05 VITALS — BP 146/70 | HR 68 | Temp 97.6°F | Resp 12 | Ht 64.0 in | Wt 191.6 lb

## 2023-05-05 DIAGNOSIS — E1122 Type 2 diabetes mellitus with diabetic chronic kidney disease: Secondary | ICD-10-CM

## 2023-05-05 DIAGNOSIS — H543 Unqualified visual loss, both eyes: Secondary | ICD-10-CM

## 2023-05-05 DIAGNOSIS — I152 Hypertension secondary to endocrine disorders: Secondary | ICD-10-CM | POA: Diagnosis not present

## 2023-05-05 DIAGNOSIS — E1169 Type 2 diabetes mellitus with other specified complication: Secondary | ICD-10-CM | POA: Diagnosis not present

## 2023-05-05 DIAGNOSIS — Z0000 Encounter for general adult medical examination without abnormal findings: Secondary | ICD-10-CM | POA: Diagnosis not present

## 2023-05-05 DIAGNOSIS — N183 Chronic kidney disease, stage 3 unspecified: Secondary | ICD-10-CM | POA: Diagnosis not present

## 2023-05-05 DIAGNOSIS — M8580 Other specified disorders of bone density and structure, unspecified site: Secondary | ICD-10-CM

## 2023-05-05 DIAGNOSIS — Z0001 Encounter for general adult medical examination with abnormal findings: Secondary | ICD-10-CM

## 2023-05-05 DIAGNOSIS — E1159 Type 2 diabetes mellitus with other circulatory complications: Secondary | ICD-10-CM

## 2023-05-05 DIAGNOSIS — E785 Hyperlipidemia, unspecified: Secondary | ICD-10-CM

## 2023-05-05 DIAGNOSIS — E7800 Pure hypercholesterolemia, unspecified: Secondary | ICD-10-CM | POA: Diagnosis not present

## 2023-05-05 NOTE — Progress Notes (Signed)
Complete physical exam  Patient: Danielle Jackson   DOB: 11/29/1940   82 y.o. Female  MRN: [id number] Visit Date: 05/05/2023  Today's healthcare provider: Jacky Kindle, FNP  Re-introduced to nurse practitioner role and practice setting.  All questions answered.  Discussed provider/patient relationship and expectations.  Chief Complaint  Patient presents with   Annual Exam   Subjective    Danielle Jackson is a 82 y.o. female who presents today for a complete physical exam.  She reports consuming a general diet.  Patient is doing a lot of yard work.  She generally feels well. She reports sleeping well. She does have additional problems to discuss today.   HPI   Past Medical History:  Diagnosis Date   Diabetes mellitus without complication (HCC)    Hyperlipidemia    Hypertension    Past Surgical History:  Procedure Laterality Date   ABDOMINAL HYSTERECTOMY  1975   CATARACT EXTRACTION  03/01/2001   KNEE SURGERY  03/24/2014   Social History   Socioeconomic History   Marital status: Married    Spouse name: Not on file   Number of children: 2   Years of education: Not on file   Highest education level: High school graduate  Occupational History   Not on file  Tobacco Use   Smoking status: Never   Smokeless tobacco: Never  Substance and Sexual Activity   Alcohol use: No    Alcohol/week: 0.0 standard drinks of alcohol   Drug use: No   Sexual activity: Not on file  Other Topics Concern   Not on file  Social History Narrative   Not on file   Social Determinants of Health   Financial Resource Strain: Low Risk  (12/24/2022)   Overall Financial Resource Strain (CARDIA)    Difficulty of Paying Living Expenses: Not hard at all  Food Insecurity: No Food Insecurity (12/24/2022)   Hunger Vital Sign    Worried About Running Out of Food in the Last Year: Never true    Ran Out of Food in the Last Year: Never true  Transportation Needs: No Transportation Needs (12/24/2022)    PRAPARE - Administrator, Civil Service (Medical): No    Lack of Transportation (Non-Medical): No  Physical Activity: Insufficiently Active (12/24/2022)   Exercise Vital Sign    Days of Exercise per Week: 3 days    Minutes of Exercise per Session: 30 min  Stress: No Stress Concern Present (12/24/2022)   [employer] of Occupational Health - Occupational Stress Questionnaire    Feeling of Stress : Not at all  Social Connections: Moderately Integrated (12/24/2022)   Social Connection and Isolation Panel [NHANES]    Frequency of Communication with Friends and Family: More than three times a week    Frequency of Social Gatherings with Friends and Family: More than three times a week    Attends Religious Services: 1 to 4 times per year    Active Member of [employer] or Organizations: No    Attends Banker Meetings: Never    Marital Status: Married  Catering manager Violence: Not At Risk (12/24/2022)   Humiliation, Afraid, Rape, and Kick questionnaire    Fear of Current or Ex-Partner: No    Emotionally Abused: No    Physically Abused: No    Sexually Abused: No   Family Status  Relation Name Status   Mother  Deceased   Father  Deceased   Sister  Alive  Brother  Alive   Neg Hx  (Not Specified)  No partnership data on file   Family History  Problem Relation Age of Onset   Diabetes Mother    Heart disease Mother    Hyperlipidemia Mother    Hypertension Mother    Arthritis Mother    Mental illness Mother    Asthma Father    Cancer Father    Hypertension Sister    Asthma Brother    Breast cancer Neg Hx    Allergies  Allergen Reactions   Amoxicillin Diarrhea   Cefprozil    Hydrochlorothiazide Cough   Lovastatin     Other reaction(s): Muscle Cramps   Medrol  [Methylprednisolone]    Metformin Hcl Diarrhea    Severe Diarrhea   Metronidazole     diarrhea   Nitrofurantoin Monohyd Macro     Other reaction(s): Wheezing   Pravastatin Sodium     Other  reaction(s): Muscle Pain   Tetracycline Hives   Clarithromycin Rash    Patient Care Team: Jacky Kindle, FNP as PCP - General (Family Medicine) Thurnell Garbe, OD (Optometry)   Medications: Outpatient Medications Prior to Visit  Medication Sig   atorvastatin (LIPITOR) 10 MG tablet Take 1 tablet (10 mg total) by mouth daily.   Azelastine HCl (ASTEPRO) 0.15 % SOLN Place into the nose.   CALCIUM CARBONATE-VIT D-MIN PO Take by mouth 2 (two) times daily.   Cholecalciferol (VITAMIN D) 2000 UNITS tablet Take by mouth daily.   glipiZIDE (GLUCOTROL XL) 5 MG 24 hr tablet TAKE 1 TABLET BY MOUTH ONCE A DAY   ibuprofen (ADVIL,MOTRIN) 200 MG tablet Take by mouth as needed.   losartan (COZAAR) 50 MG tablet Take 1 tablet (50 mg total) by mouth daily.   ONETOUCH ULTRA test strip USE ONE STRIP TO CHECK GLUCOSE ONCE DAILY   No facility-administered medications prior to visit.    Review of Systems  Objective    BP (!) 146/70 (BP Location: Left Arm, Cuff Size: Large)   Pulse 68   Temp 97.6 F (36.4 C) (Temporal)   Resp 12   Ht 5\' 4"  (1.626 m)   Wt 191 lb 9.6 oz (86.9 kg)   SpO2 98%   BMI 32.89 kg/m   Physical Exam Vitals and nursing note reviewed.  Constitutional:      General: She is awake. She is not in acute distress.    Appearance: Normal appearance. She is well-developed and well-groomed. She is obese. She is not ill-appearing, toxic-appearing or diaphoretic.  HENT:     Head: Normocephalic and atraumatic.     Jaw: There is normal jaw occlusion. No trismus, tenderness, swelling or pain on movement.     Right Ear: Hearing, tympanic membrane, ear canal and external ear normal. There is no impacted cerumen.     Left Ear: Hearing, tympanic membrane, ear canal and external ear normal. There is no impacted cerumen.     Nose: Nose normal. No congestion or rhinorrhea.     Right Turbinates: Not enlarged, swollen or pale.     Left Turbinates: Not enlarged, swollen or pale.     Right Sinus: No  maxillary sinus tenderness or frontal sinus tenderness.     Left Sinus: No maxillary sinus tenderness or frontal sinus tenderness.     Mouth/Throat:     Lips: Pink.     Mouth: Mucous membranes are moist. No injury.     Tongue: No lesions.     Pharynx: Oropharynx is clear. Uvula  midline. No pharyngeal swelling, oropharyngeal exudate, posterior oropharyngeal erythema or uvula swelling.     Tonsils: No tonsillar exudate or tonsillar abscesses.  Eyes:     General: Lids are normal. Lids are everted, no foreign bodies appreciated. Vision grossly intact. Gaze aligned appropriately. No allergic shiner or visual field deficit.       Right eye: No discharge.        Left eye: No discharge.     Extraocular Movements: Extraocular movements intact.     Conjunctiva/sclera: Conjunctivae normal.     Right eye: Right conjunctiva is not injected. No exudate.    Left eye: Left conjunctiva is not injected. No exudate.    Pupils: Pupils are equal, round, and reactive to light.  Neck:     Thyroid: No thyroid mass, thyromegaly or thyroid tenderness.     Vascular: No carotid bruit.     Trachea: Trachea normal.  Cardiovascular:     Rate and Rhythm: Normal rate and regular rhythm.     Pulses: Normal pulses.          Carotid pulses are 2+ on the right side and 2+ on the left side.      Radial pulses are 2+ on the right side and 2+ on the left side.       Dorsalis pedis pulses are 2+ on the right side and 2+ on the left side.       Posterior tibial pulses are 2+ on the right side and 2+ on the left side.     Heart sounds: Normal heart sounds, S1 normal and S2 normal. No murmur heard.    No friction rub. No gallop.  Pulmonary:     Effort: Pulmonary effort is normal. No respiratory distress.     Breath sounds: Normal breath sounds and air entry. No stridor. No wheezing, rhonchi or rales.  Chest:     Chest wall: No tenderness.  Abdominal:     General: Abdomen is flat. Bowel sounds are normal. There is no  distension.     Palpations: Abdomen is soft. There is no mass.     Tenderness: There is no abdominal tenderness. There is no right CVA tenderness, left CVA tenderness, guarding or rebound.     Hernia: No hernia is present.  Genitourinary:    Comments: Exam deferred; denies complaints Musculoskeletal:        General: No swelling, tenderness, deformity or signs of injury. Normal range of motion.     Cervical back: Full passive range of motion without pain, normal range of motion and neck supple. No edema, rigidity or tenderness. No muscular tenderness.     Right lower leg: Edema present.     Left lower leg: No edema.     Comments: Non pitting edema to RLE/ankle s/s to chronic R knee pain   Lymphadenopathy:     Cervical: No cervical adenopathy.     Right cervical: No superficial, deep or posterior cervical adenopathy.    Left cervical: No superficial, deep or posterior cervical adenopathy.  Skin:    General: Skin is warm and dry.     Capillary Refill: Capillary refill takes less than 2 seconds.     Coloration: Skin is not jaundiced or pale.     Findings: No bruising, erythema, lesion or rash.  Neurological:     General: No focal deficit present.     Mental Status: She is alert and oriented to person, place, and time. Mental status is at baseline.  GCS: GCS eye subscore is 4. GCS verbal subscore is 5. GCS motor subscore is 6.     Sensory: Sensation is intact. No sensory deficit.     Motor: Motor function is intact. No weakness.     Coordination: Coordination is intact. Coordination normal.     Gait: Gait is intact. Gait normal.  Psychiatric:        Attention and Perception: Attention and perception normal.        Mood and Affect: Mood and affect normal.        Speech: Speech normal.        Behavior: Behavior normal. Behavior is cooperative.        Thought Content: Thought content normal.        Cognition and Memory: Cognition and memory normal.        Judgment: Judgment normal.       Last depression screening scores    05/05/2023    8:53 AM 12/24/2022    9:56 AM 12/19/2022    9:18 AM  PHQ 2/9 Scores  PHQ - 2 Score 0 0 0  PHQ- 9 Score 0  0   Last fall risk screening    05/05/2023    8:53 AM  Fall Risk   Falls in the past year? 0  Number falls in past yr: 0  Injury with Fall? 0  Risk for fall due to : No Fall Risks  Follow up Falls evaluation completed   Last Audit-C alcohol use screening    12/24/2022    9:56 AM  Alcohol Use Disorder Test (AUDIT)  1. How often do you have a drink containing alcohol? 0  2. How many drinks containing alcohol do you have on a typical day when you are drinking? 0  3. How often do you have six or more drinks on one occasion? 0  AUDIT-C Score 0   A score of 3 or more in women, and 4 or more in men indicates increased risk for alcohol abuse, EXCEPT if all of the points are from question 1   No results found for any visits on 05/05/23.  Assessment & Plan    Routine Health Maintenance and Physical Exam  Exercise Activities and Dietary recommendations  Goals      DIET - EAT MORE FRUITS AND VEGETABLES     DIET - INCREASE WATER INTAKE     Recommend to drink at least 6-8 8oz glasses of water per day.        Immunization History  Administered Date(s) Administered   Fluad Quad(high Dose 65+) 07/01/2022   Influenza Split 06/25/2010, 05/20/2011   Influenza, High Dose Seasonal PF 06/06/2014, 06/08/2015, 06/03/2016, 06/24/2017, 07/02/2019   Influenza,inj,Quad PF,6+ Mos 05/03/2013   Influenza-Unspecified 06/01/2014, 06/03/2016, 07/01/2018, 07/01/2021   Pneumococcal Conjugate-13 06/06/2014   Pneumococcal Polysaccharide-23 07/03/2007   Td 08/02/2004   Tdap 05/20/2011   Zoster, Live 01/04/2013    Health Maintenance  Topic Date Due   Zoster Vaccines- Shingrix (1 of 2) 04/25/1992   DTaP/Tdap/Td (3 - Td or Tdap) 05/19/2021   DEXA SCAN  06/15/2022   OPHTHALMOLOGY EXAM  07/01/2022   INFLUENZA VACCINE  11/30/2023  (Originally 04/02/2023)   HEMOGLOBIN A1C  05/07/2023   Diabetic kidney evaluation - eGFR measurement  11/04/2023   Diabetic kidney evaluation - Urine ACR  11/04/2023   FOOT EXAM  11/04/2023   Medicare Annual Wellness (AWV)  12/24/2023   Pneumonia Vaccine 88+ Years old  Completed   HPV VACCINES  Aged  Out   COVID-19 Vaccine  Discontinued    Discussed health benefits of physical activity, and encouraged her to engage in regular exercise appropriate for her age and condition.  Problem List Items Addressed This Visit       Cardiovascular and Mediastinum   Hypertension associated with diabetes (HCC)    Chronic, remains elevated Goal of 129/79 recommended Patient continues to have some improvement with diet and exercise; however, possibly could titrate additional medications cautiously to assist in meeting goals Continues losartan 50 mg daily at this time         Endocrine   Controlled type 2 diabetes mellitus with stage 3 chronic kidney disease, without long-term current use of insulin (HCC)    Chronic; last eGFR 56 Followed by nephrology Continues to work on HTN and dietary controls       Hyperlipidemia associated with type 2 diabetes mellitus (HCC)    Chronic, elevated Remains on low dose high intensity statin at 10 mg Repeat LP LDL goal <70 recommend given known DM recommend diet low in saturated fat and regular exercise - 30 min at least 5 times per week       Relevant Orders   Comprehensive Metabolic Panel (CMET)   TSH   Lipid panel   CBC   Hemoglobin A1c   Urine Microalbumin w/creat. ratio   RESOLVED: Type 2 diabetes mellitus with hypercholesterolemia (HCC)   Relevant Orders   Comprehensive Metabolic Panel (CMET)   TSH   Lipid panel   CBC   Hemoglobin A1c   Urine Microalbumin w/creat. ratio     Musculoskeletal and Integument   Osteopenia    Chronic, unknown Declines repeat DEXA Continues on calcium/vit d supplementation  No concerns at this time          Other   Annual physical exam - Primary    UTD on dental and vision; vision requested Delines mammogram and DEXA Defer colon given age Things to do to keep yourself healthy  - Exercise at least 30-45 minutes a day, 3-4 days a week.  - Eat a low-fat diet with lots of fruits and vegetables, up to 7-9 servings per day.  - Seatbelts can save your life. Wear them always.  - Smoke detectors on every level of your home, check batteries every year.  - Eye Doctor - have an eye exam every 1-2 years  - Safe sex - if you may be exposed to STDs, use a condom.  - Alcohol -  If you drink, do it moderately, less than 2 drinks per day.  - Health Care Power of Attorney. Choose someone to speak for you if you are not able.  - Depression is common in our stressful world.If you're feeling down or losing interest in things you normally enjoy, please come in for a visit.  - Violence - If anyone is threatening or hurting you, please call immediately. Recommend Shigrix at local pharm       Relevant Orders   Comprehensive Metabolic Panel (CMET)   TSH   Lipid panel   CBC   Hemoglobin A1c   Urine Microalbumin w/creat. ratio   Impaired vision in both eyes    Chronic; stable Continues with stable eye exams and use of eyeglasses; able to renew driver's license this year which was helpful for when her husband was unable to drive      Return in about 6 months (around 11/02/2023) for chonic disease management.    IJacky Kindle, FNP,  have reviewed all documentation for this visit. The documentation on 05/05/23 for the exam, diagnosis, procedures, and orders are all accurate and complete.  Jacky Kindle, FNP  [facility] [phone] (phone) [phone] (fax)  [facility]

## 2023-05-05 NOTE — Patient Instructions (Signed)
The CDC recommends two doses of Shingrix (the new shingles vaccine) separated by 2 to 6 months for adults age 81 years and older. I recommend checking with your insurance plan regarding coverage for this vaccine.    

## 2023-05-05 NOTE — Assessment & Plan Note (Signed)
Chronic, unknown Declines repeat DEXA Continues on calcium/vit d supplementation  No concerns at this time

## 2023-05-05 NOTE — Assessment & Plan Note (Signed)
Chronic; stable Continues with stable eye exams and use of eyeglasses; able to renew driver's license this year which was helpful for when her husband was unable to drive

## 2023-05-05 NOTE — Assessment & Plan Note (Signed)
UTD on dental and vision; vision requested Delines mammogram and DEXA Defer colon given age Things to do to keep yourself healthy  - Exercise at least 30-45 minutes a day, 3-4 days a week.  - Eat a low-fat diet with lots of fruits and vegetables, up to 7-9 servings per day.  - Seatbelts can save your life. Wear them always.  - Smoke detectors on every level of your home, check batteries every year.  - Eye Doctor - have an eye exam every 1-2 years  - Safe sex - if you may be exposed to STDs, use a condom.  - Alcohol -  If you drink, do it moderately, less than 2 drinks per day.  - Health Care Power of Attorney. Choose someone to speak for you if you are not able.  - Depression is common in our stressful world.If you're feeling down or losing interest in things you normally enjoy, please come in for a visit.  - Violence - If anyone is threatening or hurting you, please call immediately. Recommend Shigrix at local pharm

## 2023-05-05 NOTE — Assessment & Plan Note (Signed)
Chronic; last eGFR 56 Followed by nephrology Continues to work on HTN and dietary controls

## 2023-05-05 NOTE — Assessment & Plan Note (Signed)
Chronic, remains elevated Goal of 129/79 recommended Patient continues to have some improvement with diet and exercise; however, possibly could titrate additional medications cautiously to assist in meeting goals Continues losartan 50 mg daily at this time

## 2023-05-05 NOTE — Assessment & Plan Note (Signed)
Chronic, elevated Remains on low dose high intensity statin at 10 mg Repeat LP LDL goal <70 recommend given known DM recommend diet low in saturated fat and regular exercise - 30 min at least 5 times per week

## 2023-05-06 LAB — LIPID PANEL
Chol/HDL Ratio: 4.1 ratio (ref 0.0–4.4)
Cholesterol, Total: 175 mg/dL (ref 100–199)
HDL: 43 mg/dL (ref >39)
LDL Chol Calc (NIH): 87 mg/dL (ref 0–99)
Triglycerides: 271 mg/dL — ABNORMAL HIGH (ref 0–149)
VLDL Cholesterol Cal: 45 mg/dL — ABNORMAL HIGH (ref 5–40)

## 2023-05-06 LAB — CBC
Hematocrit: 40.2 % (ref 34.0–46.6)
Hemoglobin: 13.9 g/dL (ref 11.1–15.9)
MCH: 33.8 pg — ABNORMAL HIGH (ref 26.6–33.0)
MCHC: 34.6 g/dL (ref 31.5–35.7)
MCV: 98 fL — ABNORMAL HIGH (ref 79–97)
Platelets: 172 10*3/uL (ref 150–450)
RBC: 4.11 x10E6/uL (ref 3.77–5.28)
RDW: 13.1 % (ref 11.7–15.4)
WBC: 6.5 10*3/uL (ref 3.4–10.8)

## 2023-05-06 LAB — COMPREHENSIVE METABOLIC PANEL
ALT: 15 IU/L (ref 0–32)
AST: 22 IU/L (ref 0–40)
Albumin: 4 g/dL (ref 3.7–4.7)
Alkaline Phosphatase: 80 IU/L (ref 44–121)
BUN/Creatinine Ratio: 16 (ref 12–28)
BUN: 19 mg/dL (ref 8–27)
Bilirubin Total: 0.5 mg/dL (ref 0.0–1.2)
CO2: 23 mmol/L (ref 20–29)
Calcium: 9.7 mg/dL (ref 8.7–10.3)
Chloride: 106 mmol/L (ref 96–106)
Creatinine, Ser: 1.2 mg/dL — ABNORMAL HIGH (ref 0.57–1.00)
Globulin, Total: 2.5 g/dL (ref 1.5–4.5)
Glucose: 141 mg/dL — ABNORMAL HIGH (ref 70–99)
Potassium: 4.2 mmol/L (ref 3.5–5.2)
Sodium: 142 mmol/L (ref 134–144)
Total Protein: 6.5 g/dL (ref 6.0–8.5)
eGFR: 45 mL/min/{1.73_m2} — ABNORMAL LOW (ref >59)

## 2023-05-06 LAB — MICROALBUMIN / CREATININE URINE RATIO
Creatinine, Urine: 115.4 mg/dL
Microalb/Creat Ratio: 394 mg/g{creat} — ABNORMAL HIGH (ref 0–29)
Microalbumin, Urine: 454.1 ug/mL

## 2023-05-06 LAB — TSH: TSH: 1.8 u[IU]/mL (ref 0.450–4.500)

## 2023-05-06 LAB — HEMOGLOBIN A1C
Est. average glucose Bld gHb Est-mCnc: 140 mg/dL
Hgb A1c MFr Bld: 6.5 % — ABNORMAL HIGH (ref 4.8–5.6)

## 2023-05-06 NOTE — Progress Notes (Signed)
Improved urine micro despite elevation; continue to focus on blood pressure control

## 2023-06-29 ENCOUNTER — Telehealth: Payer: Self-pay

## 2023-06-29 NOTE — Telephone Encounter (Unsigned)
Copied from CRM [id number]. Topic: General - Other >> Jun 29, 2023  2:40 PM Phill Myron wrote: Senior flu shot received today at [facility]

## 2023-06-30 NOTE — Telephone Encounter (Signed)
Chart updated

## 2023-07-01 DIAGNOSIS — N1831 Chronic kidney disease, stage 3a: Secondary | ICD-10-CM | POA: Diagnosis not present

## 2023-07-01 DIAGNOSIS — E785 Hyperlipidemia, unspecified: Secondary | ICD-10-CM | POA: Diagnosis not present

## 2023-07-01 DIAGNOSIS — R809 Proteinuria, unspecified: Secondary | ICD-10-CM | POA: Diagnosis not present

## 2023-07-01 DIAGNOSIS — E1122 Type 2 diabetes mellitus with diabetic chronic kidney disease: Secondary | ICD-10-CM | POA: Diagnosis not present

## 2023-07-01 DIAGNOSIS — I10 Essential (primary) hypertension: Secondary | ICD-10-CM | POA: Diagnosis not present

## 2023-07-01 DIAGNOSIS — R8290 Unspecified abnormal findings in urine: Secondary | ICD-10-CM | POA: Diagnosis not present

## 2023-09-08 ENCOUNTER — Ambulatory Visit: Payer: Self-pay | Admitting: *Deleted

## 2023-09-08 NOTE — Telephone Encounter (Signed)
 Summary: Rash, has appt thursday   Pt reporting that she has a rash, no swelling or significant pain. Has appt with Dr. Leonard Schwartz Thursday.        Called patient # [phone] to review sx of rash . No answer, LVMTCB [phone].

## 2023-09-08 NOTE — Telephone Encounter (Signed)
  Chief Complaint: rash- groin area Symptoms: red, irritated skin- some itching Frequency: 2 weeks Pertinent Negatives: Patient denies fever- other symptoms Disposition: [] ED /[] Urgent Care (no appt availability in office) / [x] Appointment(In office/virtual)/ []  [facility]/ [] Home Care/ [] Refused Recommended Disposition /[] [place] [facility]/ []  Follow-up with PCP Additional Notes: Patient states she developed rash after using new body wash- but only in groin area. Patient has tried multiple OTC treatments- antifungal, steriod cream without relief. Appointment scheduled.    Reason for Disposition  Mild widespread rash  (Exception: Heat rash lasting 3 days or less.)  Answer Assessment - Initial Assessment Questions 1. APPEARANCE of RASH: Describe the rash. (e.g., spots, blisters, raised areas, skin peeling, scaly)     Itching and burning after using new body wash 2. SIZE: How big are the spots? (e.g., tip of pen, eraser, coin; inches, centimeters)     Red irritated skin 3. LOCATION: Where is the rash located?     Groin area- inside of leg, vagina 4. COLOR: What color is the rash? (Note: It is difficult to assess rash color in people with darker-colored skin. When this situation occurs, simply ask the caller to describe what they see.)     red 5. ONSET: When did the rash begin?     2 weeks ago 6. FEVER: Do you have a fever? If Yes, ask: What is your temperature, how was it measured, and when did it start?     no 7. ITCHING: Does the rash itch? If Yes, ask: How bad is the itch? (Scale 1-10; or mild, moderate, severe)     Mild- cream helps 8. CAUSE: What do you think is causing the rash?     Unsure- thought reaction to body wash 9. MEDICINE FACTORS: Have you started any new medicines within the last 2 weeks? (e.g., antibiotics)      no 10. OTHER SYMPTOMS: Do you have any other symptoms? (e.g., dizziness, headache, sore throat, joint pain)        no  Protocols used: Rash or Redness - [facility]

## 2023-09-09 ENCOUNTER — Ambulatory Visit (INDEPENDENT_AMBULATORY_CARE_PROVIDER_SITE_OTHER): Payer: PPO | Admitting: Physician Assistant

## 2023-09-09 ENCOUNTER — Encounter: Payer: Self-pay | Admitting: Physician Assistant

## 2023-09-09 VITALS — BP 122/82 | HR 81 | Resp 16 | Ht 64.0 in | Wt 190.0 lb

## 2023-09-09 DIAGNOSIS — B372 Candidiasis of skin and nail: Secondary | ICD-10-CM

## 2023-09-09 DIAGNOSIS — B3789 Other sites of candidiasis: Secondary | ICD-10-CM

## 2023-09-09 MED ORDER — FLUCONAZOLE 150 MG PO TABS
150.0000 mg | ORAL_TABLET | ORAL | 0 refills | Status: DC | PRN
Start: 2023-09-09 — End: 2023-11-02

## 2023-09-09 MED ORDER — NYSTATIN 100000 UNIT/GM EX POWD
1.0000 | Freq: Three times a day (TID) | CUTANEOUS | 0 refills | Status: DC
Start: 2023-09-09 — End: 2024-02-03

## 2023-09-09 MED ORDER — TRIAMCINOLONE ACETONIDE 0.5 % EX OINT
1.0000 | TOPICAL_OINTMENT | Freq: Two times a day (BID) | CUTANEOUS | 0 refills | Status: DC
Start: 2023-09-09 — End: 2024-02-03

## 2023-09-09 NOTE — Progress Notes (Signed)
 Acute Office Visit   Patient: Danielle Jackson   DOB: 1940/11/24   83 y.o. Female  MRN: [id number] Visit Date: 09/09/2023  Today's healthcare provider: Rocky BRAVO Daneya Hartgrove, PA-C  Introduced myself to the patient as a PA-C and provided education on APPs in clinical practice.    Chief Complaint  Patient presents with   Rash    Groin/Armpits, x2 weeks. Itching   Subjective    HPI HPI     Rash    Additional comments: Groin/Armpits, x2 weeks. Itching      Last edited by Dann Kirsch, CMA on 09/09/2023 10:04 AM.      DONETA Duration:  weeks - ongoing for 2 weeks  Location: groin and armpits  Itching: yes Burning: yes in groin region  Redness: yes Oozing: no Scaling: no Blisters: no Painful: yesI groin region - especially with tighter clothing or rubbing  Fevers: no Change in detergents/soaps/personal care products: yes- she has tried a new body wash (Dial antibacterial) and started having itching after first use. She has stopped using it.  Recent illness: no Recent travel:no History of same: no Context: stable- not improving  Alleviating factors: nothing Treatments attempted: Lotrimin, lamisil, clobetasol , Fungicure wash, fungasoap wash  States the two washes caused some stinging in affected areas  Shortness of breath: no  Throat/tongue swelling: no Myalgias/arthralgias: no   Medications: Outpatient Medications Prior to Visit  Medication Sig   atorvastatin  (LIPITOR) 10 MG tablet Take 1 tablet (10 mg total) by mouth daily.   Azelastine HCl (ASTEPRO) 0.15 % SOLN Place into the nose.   CALCIUM  CARBONATE-VIT D-MIN PO Take by mouth 2 (two) times daily.   Cholecalciferol (VITAMIN D ) 2000 UNITS tablet Take by mouth daily.   glipiZIDE  (GLUCOTROL  XL) 5 MG 24 hr tablet TAKE 1 TABLET BY MOUTH ONCE A DAY   ibuprofen (ADVIL,MOTRIN) 200 MG tablet Take by mouth as needed.   losartan  (COZAAR ) 50 MG tablet Take 1 tablet (50 mg total) by mouth daily.   ONETOUCH ULTRA test strip  USE ONE STRIP TO CHECK GLUCOSE ONCE DAILY   No facility-administered medications prior to visit.    Review of Systems  See HPI for relevant ROS       Objective    BP 122/82   Pulse 81   Resp 16   Ht 5' 4 (1.626 m)   Wt 190 lb (86.2 kg)   SpO2 98%   BMI 32.61 kg/m     Physical Exam Vitals reviewed.  Constitutional:      Appearance: Normal appearance.  HENT:     Head: Normocephalic and atraumatic.  Skin:    General: Skin is warm and dry.     Findings: Erythema and rash present. Rash is macular.     Comments: Beefy macular rash along groin and lower abdomen - some mild maceration but no obvious purulent drainage or ulceration  Beefy macular rash in both axilla - no drainage or ulcerations noted   Neurological:     Mental Status: She is alert.  Psychiatric:        Mood and Affect: Mood normal.        Behavior: Behavior normal.        Thought Content: Thought content normal.        Judgment: Judgment normal.       No results found for any visits on 09/09/23.  Assessment & Plan      No follow-ups on file.  Problem List Items Addressed This Visit   None Visit Diagnoses       Candida rash of groin    -  Primary   Relevant Medications   fluconazole  (DIFLUCAN ) 150 MG tablet   triamcinolone  ointment (KENALOG ) 0.5 %   nystatin  (MYCOSTATIN /NYSTOP ) powder     Yeast dermatitis       Relevant Medications   fluconazole  (DIFLUCAN ) 150 MG tablet   triamcinolone  ointment (KENALOG ) 0.5 %   nystatin  (MYCOSTATIN /NYSTOP ) powder      Acute, new concern  She reports development of itchy, burning rash present for the past 2 weeks that is not improving with home measures  Physical exam is most consistent with yeast infection and dermatitis  Will send in script for Diflucan  x3 doses to help with vulvovaginal yeast infection and groin irritation Will also send in script for Nystatin  powder to assist with full clearance  Will provide script for Kenalog  to assist with  irritation and itching. If patient is not able to use Kenalog , recommend using diaper cream or Desitin as desired Follow up as needed for persistent or progressing symptoms    No follow-ups on file.   I, Rinda Rollyson E Jaedynn Bohlken, PA-C, have reviewed all documentation for this visit. The documentation on 09/09/23 for the exam, diagnosis, procedures, and orders are all accurate and complete.   [place]y [place], MHS, PA-C [facility]

## 2023-09-09 NOTE — Patient Instructions (Signed)
 Please take the Diflucan  (pill) once every 72 hours until finished. This will help treat the yeast infection along with the Nystatin  powder. Apply the powder twice per day to help with resolving the rash.  You can use the Kenalog  cream as needed for irritation and itching but please stop if you develop a reaction.   You can wash the affected areas with warm water and a gentle soap that is fragrance and dye free (sensitive skin formulations are best in these cases)   Let us  know if the rash is not improving over the next week or gets worse

## 2023-09-10 ENCOUNTER — Ambulatory Visit: Payer: PPO | Admitting: Family Medicine

## 2023-09-22 ENCOUNTER — Other Ambulatory Visit: Payer: Self-pay | Admitting: Family Medicine

## 2023-09-22 DIAGNOSIS — E119 Type 2 diabetes mellitus without complications: Secondary | ICD-10-CM

## 2023-11-02 ENCOUNTER — Ambulatory Visit: Payer: PPO | Admitting: Family Medicine

## 2023-11-02 ENCOUNTER — Ambulatory Visit (INDEPENDENT_AMBULATORY_CARE_PROVIDER_SITE_OTHER): Payer: HMO | Admitting: Family Medicine

## 2023-11-02 ENCOUNTER — Encounter: Payer: Self-pay | Admitting: Family Medicine

## 2023-11-02 VITALS — BP 160/63 | HR 70 | Resp 16 | Ht 64.0 in | Wt 190.5 lb

## 2023-11-02 DIAGNOSIS — E1169 Type 2 diabetes mellitus with other specified complication: Secondary | ICD-10-CM

## 2023-11-02 DIAGNOSIS — M8580 Other specified disorders of bone density and structure, unspecified site: Secondary | ICD-10-CM | POA: Diagnosis not present

## 2023-11-02 DIAGNOSIS — N3000 Acute cystitis without hematuria: Secondary | ICD-10-CM

## 2023-11-02 DIAGNOSIS — R300 Dysuria: Secondary | ICD-10-CM | POA: Diagnosis not present

## 2023-11-02 DIAGNOSIS — J301 Allergic rhinitis due to pollen: Secondary | ICD-10-CM | POA: Diagnosis not present

## 2023-11-02 DIAGNOSIS — E1159 Type 2 diabetes mellitus with other circulatory complications: Secondary | ICD-10-CM | POA: Diagnosis not present

## 2023-11-02 DIAGNOSIS — E785 Hyperlipidemia, unspecified: Secondary | ICD-10-CM | POA: Diagnosis not present

## 2023-11-02 DIAGNOSIS — E559 Vitamin D deficiency, unspecified: Secondary | ICD-10-CM | POA: Diagnosis not present

## 2023-11-02 DIAGNOSIS — I152 Hypertension secondary to endocrine disorders: Secondary | ICD-10-CM | POA: Diagnosis not present

## 2023-11-02 DIAGNOSIS — Z9889 Other specified postprocedural states: Secondary | ICD-10-CM

## 2023-11-02 DIAGNOSIS — E1122 Type 2 diabetes mellitus with diabetic chronic kidney disease: Secondary | ICD-10-CM

## 2023-11-02 DIAGNOSIS — B379 Candidiasis, unspecified: Secondary | ICD-10-CM

## 2023-11-02 DIAGNOSIS — N183 Chronic kidney disease, stage 3 unspecified: Secondary | ICD-10-CM | POA: Diagnosis not present

## 2023-11-02 LAB — POCT URINALYSIS DIPSTICK
Bilirubin, UA: NEGATIVE
Glucose, UA: NEGATIVE
Ketones, UA: NEGATIVE
Nitrite, UA: NEGATIVE
Protein, UA: NEGATIVE
Spec Grav, UA: 1.02 (ref 1.010–1.025)
Urobilinogen, UA: 0.2 U/dL
pH, UA: 6 (ref 5.0–8.0)

## 2023-11-02 MED ORDER — FLUCONAZOLE 150 MG PO TABS: 150.0000 mg | ORAL_TABLET | Freq: Once | ORAL | 0 refills | Status: AC

## 2023-11-02 MED ORDER — CIPROFLOXACIN HCL 500 MG PO TABS: 500.0000 mg | ORAL_TABLET | Freq: Two times a day (BID) | ORAL | 0 refills | Status: AC

## 2023-11-02 NOTE — Assessment & Plan Note (Signed)
 Ongoing chronic knee pain. Does not want further surgical intervention. Handicap placard filled out and given to patient today.

## 2023-11-02 NOTE — Progress Notes (Signed)
 Established patient visit   Patient: Danielle Jackson   DOB: 09-01-1941   83 y.o. Female  MRN: [id number] Visit Date: 11/02/2023  Today's healthcare provider: Sherlyn Hay, DO   Chief Complaint  Patient presents with   Medical Management of Chronic Issues    Follow-up T2DM, HTN and Hyperlipidemia   Subjective    HPI Danielle Jackson is an 83 year old female with hypertension and diabetes who presents for a routine follow-up and evaluation of urinary symptoms.  She experiences urinary symptoms, including increased frequency and dysuria, which began overnight. She has a history of urinary tract infections and is currently using a pad due to urinary urgency.   Her blood pressure was elevated today, although it was well-controlled two months ago. She attributes the increase to poor sleep and frequent nocturia. She has a history of hypertension and monitors her blood pressure at home using both wrist and arm devices.   She has diabetes and is currently taking glipizide 5 mg. Her blood sugar levels sometimes fluctuate, with the lowest being 88. No nausea, vomiting, or abdominal pain.  She has a history of muscle cramping with cholesterol medications such as lovastatin and pravastatin, as well as her current atorvastatin, leading her to take breaks from these medications. She has been off atorvastatin since the end of December and plans to restart it. She takes the medication around lunch.  She experienced allergy symptoms after doing yard work without a mask, which she managed with chlorpheniramine every four hours during the night and azelastine spray. She did not take any allergy medication this morning.  She has a history of knee issues, having had a torn tendon removed, and experiences pain if she twists her knee the wrong way. She wants to avoid further knee surgery due to concerns about potential worsening of her condition.     Medications: Outpatient Medications Prior to  Visit  Medication Sig   Azelastine HCl (ASTEPRO) 0.15 % SOLN Place into the nose.   CALCIUM CARBONATE-VIT D-MIN PO Take by mouth 2 (two) times daily.   chlorpheniramine (CHLOR-TRIMETON) 4 MG tablet Take 4 mg by mouth 2 (two) times daily as needed for allergies.   Cholecalciferol (VITAMIN D) 2000 UNITS tablet Take by mouth daily.   glipiZIDE (GLUCOTROL XL) 5 MG 24 hr tablet TAKE 1 TABLET BY MOUTH ONCE A DAY   ibuprofen (ADVIL,MOTRIN) 200 MG tablet Take by mouth as needed.   losartan (COZAAR) 50 MG tablet Take 1 tablet (50 mg total) by mouth daily.   nystatin (MYCOSTATIN/NYSTOP) powder Apply 1 Application topically 3 (three) times daily.   ONETOUCH ULTRA TEST test strip USE ONE STRIP TO CHECK GLUCOSE ONCE DAILY   triamcinolone ointment (KENALOG) 0.5 % Apply 1 Application topically 2 (two) times daily.   atorvastatin (LIPITOR) 10 MG tablet Take 1 tablet (10 mg total) by mouth daily. (Patient not taking: Reported on 11/02/2023)   [DISCONTINUED] fluconazole (DIFLUCAN) 150 MG tablet Take 1 tablet (150 mg total) by mouth every three (3) days as needed. May repeat in 3 days if symptoms not resolved   No facility-administered medications prior to visit.    Review of Systems  Constitutional:  Negative for appetite change, chills, fatigue and fever.  Respiratory:  Negative for chest tightness and shortness of breath.   Cardiovascular:  Negative for chest pain and palpitations.  Gastrointestinal:  Negative for abdominal pain, nausea and vomiting.  Genitourinary:  Positive for decreased urine volume, dysuria, frequency  and urgency.  Neurological:  Negative for dizziness and weakness.        Objective    BP (!) 160/63 (BP Location: Left Arm, Patient Position: Sitting, Cuff Size: Large)   Pulse 70   Resp 16   Ht 5\' 4"  (1.626 m)   Wt 190 lb 8 oz (86.4 kg)   SpO2 97%   BMI 32.70 kg/m     Physical Exam Vitals and nursing note reviewed.  Constitutional:      General: She is not in acute  distress.    Appearance: Normal appearance.  HENT:     Head: Normocephalic and atraumatic.  Eyes:     General: No scleral icterus.    Conjunctiva/sclera: Conjunctivae normal.  Cardiovascular:     Rate and Rhythm: Normal rate.  Pulmonary:     Effort: Pulmonary effort is normal.  Neurological:     Mental Status: She is alert and oriented to person, place, and time. Mental status is at baseline.  Psychiatric:        Mood and Affect: Mood normal.        Behavior: Behavior normal.      Results for orders placed or performed in visit on 11/02/23  POCT urinalysis dipstick  Result Value Ref Range   Color, UA Yellow    Clarity, UA clear    Glucose, UA Negative Negative   Bilirubin, UA Negative    Ketones, UA Negative    Spec Grav, UA 1.020 1.010 - 1.025   Blood, UA Large    pH, UA 6.0 5.0 - 8.0   Protein, UA Negative Negative   Urobilinogen, UA 0.2 0.2 or 1.0 E.U./dL   Nitrite, UA Negative    Leukocytes, UA Small (1+) (A) Negative   Appearance     Odor      Assessment & Plan    Controlled type 2 diabetes mellitus with stage 3 chronic kidney disease, without long-term current use of insulin (HCC) Assessment & Plan: Currently managed on glipizide 5 mg. Blood glucose levels fluctuate, with the lowest recorded at 88 mg/dL.   - Continue glipizide 5 mg daily   - Order A1c test with current blood work    Orders: -     Comprehensive metabolic panel -     Hemoglobin A1c -     Microalbumin / creatinine urine ratio  Hyperlipidemia associated with type 2 diabetes mellitus (HCC) Assessment & Plan: Discontinued atorvastatin due to muscle cramps. Previous medications include lovastatin and pravastatin, both causing muscle cramping. Off medication since December. Advised taking atorvastatin  at night to reduce muscle cramping.   - Restart atorvastatin and take it at night   - Order lipid panel with current blood work    Orders: -     Lipid panel  Hypertension associated with  diabetes (HCC) Assessment & Plan: Blood pressure elevated during the visit. Reports poor sleep and increased stress due to feeling unwell, which may contribute to elevated readings. Blood pressure was well-controlled two months ago.   - Recheck blood pressure at home using an arm cuff   - Recheck blood pressure in the clinic in three months   - Monitor blood pressure readings at home and bring the cuff to the next appointment for comparison     Avitaminosis D -     VITAMIN D 25 Hydroxy (Vit-D Deficiency, Fractures)  Osteopenia, unspecified location Assessment & Plan: Last DEXA scan in 2018 showed T score in the osteopenic range. Overdue for a follow-up  scan. Discussed the importance of monitoring bone density to prevent fractures. Emphasized that hip fractures can significantly reduce life expectancy.   - Order DEXA scan   - Encourage weight-bearing exercises and adequate calcium and vitamin D intake     Dysuria -     POCT urinalysis dipstick -     Urine Culture -     Urine Microscopic -     Ciprofloxacin HCl; Take 1 tablet (500 mg total) by mouth 2 (two) times daily for 3 days.  Dispense: 6 tablet; Refill: 0  Antibiotic-induced yeast infection -     Fluconazole; Take 1 tablet (150 mg total) by mouth once for 1 dose.  Dispense: 1 tablet; Refill: 0  S/P arthroscopic surgery of right knee Assessment & Plan: Ongoing chronic knee pain. Does not want further surgical intervention. Handicap placard filled out and given to patient today.   Acute cystitis without hematuria  Seasonal allergic rhinitis due to pollen Assessment & Plan: Exacerbation of allergy symptoms after yard work. Currently using chlorpheniramine and azelastine spray intermittently. Symptoms include nasal congestion and discomfort. Advised wearing a mask during yard work to reduce allergen exposure.   - Continue chlorpheniramine as needed   - Use azelastine spray as needed   - Wear a mask during yard work       Acute cystitis without hematuria Reports dysuria and increased urinary frequency. Urine analysis shows leukocytes and red blood cells. Prefers empirical treatment due to symptoms. Informed about the risk of tendon rupture with ciprofloxacin and advised to monitor for tendon pain.   - Prescribe ciprofloxacin for 3 days   - Advise taking probiotics at least 2 hours apart from antibiotics   - Monitor for symptoms of tendon pain and discontinue ciprofloxacin if she occurs   - Send urine culture to confirm diagnosis    General Health Maintenance   Due for Medicare annual wellness visit in April. Discussed the importance of regular screenings and preventive care.   - Schedule Medicare annual wellness visit for April 29     Return in about 3 months (around 02/02/2024) for CPE.    Patient advised to bring home BP cuff to next visit for validation.  I discussed the assessment and treatment plan with the patient  The patient was provided an opportunity to ask questions and all were answered. The patient agreed with the plan and demonstrated an understanding of the instructions.   The patient was advised to call back or seek an in-person evaluation if the symptoms worsen or if the condition fails to improve as anticipated.    Sherlyn Hay, DO  [facility] [phone] (phone) [phone] (fax)  [facility]

## 2023-11-02 NOTE — Patient Instructions (Signed)
Check your blood pressure once daily, and any time you have concerning symptoms like headache, chest pain, dizziness, shortness of breath, or vision changes.   Our goal is less than 140/90.  To appropriately check your blood pressure, make sure you do the following:  1) Avoid caffeine, exercise, or tobacco products for 30 minutes before checking. Empty your bladder. 2) Sit with your back supported in a flat-backed chair. Rest your arm on something flat (arm of the chair, table, etc). 3) Sit still with your feet flat on the floor, resting, for at least 5 minutes.  4) Check your blood pressure. Take 1-2 readings.  5) Write down these readings and bring with you to any provider appointments.  Bring your home blood pressure machine with you to a provider's office for accuracy comparison at least once a year.   Make sure you take your blood pressure medications before you come to any office visit, even if you were asked to fast for labs.

## 2023-11-02 NOTE — Assessment & Plan Note (Signed)
 Blood pressure elevated during the visit. Reports poor sleep and increased stress due to feeling unwell, which may contribute to elevated readings. Blood pressure was well-controlled two months ago.   - Recheck blood pressure at home using an arm cuff   - Recheck blood pressure in the clinic in three months   - Monitor blood pressure readings at home and bring the cuff to the next appointment for comparison

## 2023-11-02 NOTE — Assessment & Plan Note (Signed)
 Last DEXA scan in 2018 showed T score in the osteopenic range. Overdue for a follow-up scan. Discussed the importance of monitoring bone density to prevent fractures. Emphasized that hip fractures can significantly reduce life expectancy.   - Order DEXA scan   - Encourage weight-bearing exercises and adequate calcium and vitamin D intake

## 2023-11-02 NOTE — Assessment & Plan Note (Signed)
 Exacerbation of allergy symptoms after yard work. Currently using chlorpheniramine and azelastine spray intermittently. Symptoms include nasal congestion and discomfort. Advised wearing a mask during yard work to reduce allergen exposure.   - Continue chlorpheniramine as needed   - Use azelastine spray as needed   - Wear a mask during yard work

## 2023-11-02 NOTE — Assessment & Plan Note (Signed)
 Discontinued atorvastatin due to muscle cramps. Previous medications include lovastatin and pravastatin, both causing muscle cramping. Off medication since December. Advised taking atorvastatin  at night to reduce muscle cramping.   - Restart atorvastatin and take it at night   - Order lipid panel with current blood work

## 2023-11-02 NOTE — Assessment & Plan Note (Signed)
 Currently managed on glipizide 5 mg. Blood glucose levels fluctuate, with the lowest recorded at 88 mg/dL.   - Continue glipizide 5 mg daily   - Order A1c test with current blood work

## 2023-11-03 ENCOUNTER — Encounter: Payer: Self-pay | Admitting: Family Medicine

## 2023-11-03 LAB — URINALYSIS, MICROSCOPIC ONLY
Bacteria, UA: NONE SEEN
Casts: NONE SEEN /LPF
WBC, UA: >30 /HPF — AB (ref 0–5)

## 2023-11-03 LAB — SPECIMEN STATUS REPORT

## 2023-11-04 LAB — COMPREHENSIVE METABOLIC PANEL
ALT: 16 IU/L (ref 0–32)
AST: 19 IU/L (ref 0–40)
Albumin: 3.9 g/dL (ref 3.7–4.7)
Alkaline Phosphatase: 103 IU/L (ref 44–121)
BUN/Creatinine Ratio: 12 (ref 12–28)
BUN: 15 mg/dL (ref 8–27)
Bilirubin Total: 0.5 mg/dL (ref 0.0–1.2)
CO2: 23 mmol/L (ref 20–29)
Calcium: 9.4 mg/dL (ref 8.7–10.3)
Chloride: 105 mmol/L (ref 96–106)
Creatinine, Ser: 1.24 mg/dL — ABNORMAL HIGH (ref 0.57–1.00)
Globulin, Total: 2.8 g/dL (ref 1.5–4.5)
Glucose: 156 mg/dL — ABNORMAL HIGH (ref 70–99)
Potassium: 4.4 mmol/L (ref 3.5–5.2)
Sodium: 141 mmol/L (ref 134–144)
Total Protein: 6.7 g/dL (ref 6.0–8.5)
eGFR: 44 mL/min/{1.73_m2} — ABNORMAL LOW (ref >59)

## 2023-11-04 LAB — LIPID PANEL
Chol/HDL Ratio: 4.9 ratio — ABNORMAL HIGH (ref 0.0–4.4)
Cholesterol, Total: 197 mg/dL (ref 100–199)
HDL: 40 mg/dL (ref >39)
LDL Chol Calc (NIH): 114 mg/dL — ABNORMAL HIGH (ref 0–99)
Triglycerides: 248 mg/dL — ABNORMAL HIGH (ref 0–149)
VLDL Cholesterol Cal: 43 mg/dL — ABNORMAL HIGH (ref 5–40)

## 2023-11-04 LAB — SPECIMEN STATUS REPORT

## 2023-11-04 LAB — URINE CULTURE

## 2023-11-04 LAB — MICROALBUMIN / CREATININE URINE RATIO
Creatinine, Urine: 103.2 mg/dL
Microalb/Creat Ratio: 1137 mg/g{creat} — ABNORMAL HIGH (ref 0–29)
Microalbumin, Urine: 1173.1 ug/mL

## 2023-11-04 LAB — VITAMIN D 25 HYDROXY (VIT D DEFICIENCY, FRACTURES): Vit D, 25-Hydroxy: 32.9 ng/mL (ref 30.0–100.0)

## 2023-11-04 LAB — HEMOGLOBIN A1C
Est. average glucose Bld gHb Est-mCnc: 151 mg/dL
Hgb A1c MFr Bld: 6.9 % — ABNORMAL HIGH (ref 4.8–5.6)

## 2023-12-29 ENCOUNTER — Ambulatory Visit: Payer: Self-pay

## 2023-12-29 DIAGNOSIS — E1159 Type 2 diabetes mellitus with other circulatory complications: Secondary | ICD-10-CM

## 2023-12-29 DIAGNOSIS — Z0000 Encounter for general adult medical examination without abnormal findings: Secondary | ICD-10-CM

## 2023-12-29 NOTE — Progress Notes (Signed)
 Subjective:   Danielle Jackson is a 83 y.o. who presents for a Medicare Wellness preventive visit.  Visit Complete: Virtual I connected with  Myeesha Bourgoin Bisch on 12/29/23 by a audio enabled telemedicine application and verified that I am speaking with the correct person using two identifiers.  Patient Location: Home  Provider Location: Office/Clinic  I discussed the limitations of evaluation and management by telemedicine. The patient expressed understanding and agreed to proceed.  Vital Signs: Because this visit was a virtual/telehealth visit, some criteria may be missing or patient reported. Any vitals not documented were not able to be obtained and vitals that have been documented are patient reported.  VideoDeclined- This patient declined Librarian, academic. Therefore the visit was completed with audio only.  Persons Participating in Visit: Patient.  AWV Questionnaire: No: Patient Medicare AWV questionnaire was not completed prior to this visit.  Cardiac Risk Factors include: advanced age (>5men, >33 women);diabetes mellitus;hypertension;dyslipidemia;obesity (BMI >30kg/m2)     Objective:    Today's Vitals   12/29/23 1016  PainSc: 3    There is no height or weight on file to calculate BMI.     12/29/2023   10:21 AM 12/24/2022   10:01 AM 10/09/2021    3:12 PM 01/23/2020    3:22 PM 07/25/2019    9:31 AM 09/03/2018    4:23 PM 03/07/2015    8:19 AM  Advanced Directives  Does Patient Have a Medical Advance Directive? No Yes No No No No Yes  Type of Advance Directive       Healthcare Power of Attorney  Does patient want to make changes to medical advance directive?       No - Patient declined  Copy of Healthcare Power of Attorney in Chart?    No - copy requested     Would patient like information on creating a medical advance directive? No - Patient declined  No - Patient declined  No - Patient declined No - Patient declined     Current Medications  (verified) Outpatient Encounter Medications as of 12/29/2023  Medication Sig   Azelastine HCl (ASTEPRO) 0.15 % SOLN Place into the nose.   CALCIUM  CARBONATE-VIT D-MIN PO Take by mouth 2 (two) times daily.   chlorpheniramine (CHLOR-TRIMETON) 4 MG tablet Take 4 mg by mouth 2 (two) times daily as needed for allergies.   Cholecalciferol (VITAMIN D ) 2000 UNITS tablet Take by mouth daily.   glipiZIDE  (GLUCOTROL  XL) 5 MG 24 hr tablet TAKE 1 TABLET BY MOUTH ONCE A DAY   ibuprofen (ADVIL,MOTRIN) 200 MG tablet Take by mouth as needed.   losartan  (COZAAR ) 50 MG tablet Take 1 tablet (50 mg total) by mouth daily.   nystatin  (MYCOSTATIN /NYSTOP ) powder Apply 1 Application topically 3 (three) times daily.   ONETOUCH ULTRA TEST test strip USE ONE STRIP TO CHECK GLUCOSE ONCE DAILY   triamcinolone  ointment (KENALOG ) 0.5 % Apply 1 Application topically 2 (two) times daily.   atorvastatin  (LIPITOR) 10 MG tablet Take 1 tablet (10 mg total) by mouth daily. (Patient not taking: Reported on 12/29/2023)   No facility-administered encounter medications on file as of 12/29/2023.    Allergies (verified) Amoxicillin, Cefprozil, Hydrochlorothiazide, Lovastatin, Medrol  [methylprednisolone], Metformin hcl, Metronidazole , Nitrofurantoin monohyd macro, Pravastatin sodium, Tetracycline, and Clarithromycin   History: Past Medical History:  Diagnosis Date   Diabetes mellitus without complication (HCC)    Hyperlipidemia    Hypertension    Past Surgical History:  Procedure Laterality Date   ABDOMINAL  HYSTERECTOMY  1975   CATARACT EXTRACTION  03/01/2001   KNEE SURGERY  03/24/2014   Family History  Problem Relation Age of Onset   Diabetes Mother    Heart disease Mother    Hyperlipidemia Mother    Hypertension Mother    Arthritis Mother    Mental illness Mother    Asthma Father    Cancer Father    Hypertension Sister    Asthma Brother    Breast cancer Neg Hx    Social History   Socioeconomic History   Marital  status: Married    Spouse name: Not on file   Number of children: 2   Years of education: Not on file   Highest education level: 12th grade  Occupational History   Not on file  Tobacco Use   Smoking status: Never   Smokeless tobacco: Never  Substance and Sexual Activity   Alcohol use: No    Alcohol/week: 0.0 standard drinks of alcohol   Drug use: No   Sexual activity: Not on file  Other Topics Concern   Not on file  Social History Narrative   Not on file   Social Drivers of Health   Financial Resource Strain: Low Risk  (12/29/2023)   Overall Financial Resource Strain (CARDIA)    Difficulty of Paying Living Expenses: Not hard at all  Food Insecurity: No Food Insecurity (12/29/2023)   Hunger Vital Sign    Worried About Running Out of Food in the Last Year: Never true    Ran Out of Food in the Last Year: Never true  Transportation Needs: No Transportation Needs (12/29/2023)   PRAPARE - Administrator, Civil Service (Medical): No    Lack of Transportation (Non-Medical): No  Physical Activity: Insufficiently Active (12/29/2023)   Exercise Vital Sign    Days of Exercise per Week: 3 days    Minutes of Exercise per Session: 20 min  Stress: No Stress Concern Present (12/29/2023)   [employer] of Occupational Health - Occupational Stress Questionnaire    Feeling of Stress : Not at all  Social Connections: Moderately Isolated (12/29/2023)   Social Connection and Isolation Panel [NHANES]    Frequency of Communication with Friends and Family: More than three times a week    Frequency of Social Gatherings with Friends and Family: More than three times a week    Attends Religious Services: Never    Database administrator or Organizations: No    Attends Engineer, structural: Never    Marital Status: Married    Tobacco Counseling Counseling given: Not Answered    Clinical Intake:  Pre-visit preparation completed: Yes  Pain : 0-10 Pain Score: 3  Pain  Type: Acute pain Pain Location: Knee Pain Orientation: Right Pain Descriptors / Indicators: Aching Pain Onset: 1 to 4 weeks ago Pain Frequency: Intermittent     BMI - recorded: 32.6 Nutritional Status: BMI > 30  Obese Nutritional Risks: None Diabetes: Yes CBG done?: No Did pt. bring in CBG monitor from home?: No  Lab Results  Component Value Date   HGBA1C 6.9 (H) 11/02/2023   HGBA1C 6.5 (H) 05/05/2023   HGBA1C 6.7 (H) 11/04/2022     How often do you need to have someone help you when you read instructions, pamphlets, or other written materials from your doctor or pharmacy?: 1 - Never  Interpreter Needed?: No  Information entered by :: Dellie Fergusson, LPN   Activities of Daily Living  12/29/2023   10:22 AM  In your present state of health, do you have any difficulty performing the following activities:  Hearing? 0  Vision? 0  Difficulty concentrating or making decisions? 0  Walking or climbing stairs? 1  Comment NEEDS RAIL, KNEE PAIN  Dressing or bathing? 0  Doing errands, shopping? 0  Preparing Food and eating ? N  Using the Toilet? N  In the past six months, have you accidently leaked urine? Y  Do you have problems with loss of bowel control? N  Managing your Medications? N  Managing your Finances? N  Housekeeping or managing your Housekeeping? N    Patient Care Team: Pardue, Asencion Blacksmith, DO as PCP - General (Family Medicine) Doretta Gant, OD (Optometry)  Indicate any recent Medical Services you may have received from other than [facility] providers in the past year (date may be approximate).     Assessment:   This is a routine wellness examination for Danielle Jackson.  Hearing/Vision screen Hearing Screening - Comments:: NO AIDS Vision Screening - Comments:: WEARS GLASSES ALL DAY- IN [facility] IN [place]    Goals Addressed             This Visit's Progress    Cut out extra servings         Depression Screen     12/29/2023   10:20 AM 11/02/2023     9:52 AM 05/05/2023    8:53 AM 12/24/2022    9:56 AM 12/19/2022    9:18 AM 11/04/2022    8:39 AM 05/08/2022    9:31 AM  PHQ 2/9 Scores  PHQ - 2 Score 0 0 0 0 0 0 0  PHQ- 9 Score 0  0  0 0 0    Fall Risk     12/29/2023   10:22 AM 11/02/2023    9:51 AM 05/05/2023    8:53 AM 12/24/2022    9:55 AM 12/19/2022    9:18 AM  Fall Risk   Falls in the past year? 0 0 0 0 0  Number falls in past yr: 0 0 0 0 0  Injury with Fall? 0 0 0 0 0  Risk for fall due to : No Fall Risks No Fall Risks No Fall Risks No Fall Risks No Fall Risks  Follow up Falls prevention discussed;Falls evaluation completed  Falls evaluation completed Education provided;Falls prevention discussed Falls evaluation completed    MEDICARE RISK AT HOME:  Medicare Risk at Home Any stairs in or around the home?: Yes If so, are there any without handrails?: No Home free of loose throw rugs in walkways, pet beds, electrical cords, etc?: Yes Adequate lighting in your home to reduce risk of falls?: Yes Life alert?: No Use of a cane, walker or w/c?: No Grab bars in the bathroom?: Yes Shower chair or bench in shower?: Yes Elevated toilet seat or a handicapped toilet?: Yes  TIMED UP AND GO:  Was the test performed?  No  Cognitive Function: 6CIT completed        12/29/2023   10:24 AM 12/24/2022   10:03 AM  6CIT Screen  What Year? 0 points 0 points  What month? 0 points 0 points  What time? 0 points 0 points  Count back from 20 0 points 0 points  Months in reverse 0 points 0 points  Repeat phrase 0 points 0 points  Total Score 0 points 0 points    Immunizations Immunization History  Administered Date(s) Administered   Fluad Quad(high  Dose 65+) 07/01/2022   Influenza Split 06/25/2010, 05/20/2011   Influenza, High Dose Seasonal PF 06/06/2014, 06/08/2015, 06/03/2016, 06/24/2017, 07/02/2019   Influenza,inj,Quad PF,6+ Mos 05/03/2013   Influenza-Unspecified 06/01/2014, 06/03/2016, 07/01/2018, 07/01/2021, 06/29/2023   Pneumococcal  Conjugate-13 06/06/2014   Pneumococcal Polysaccharide-23 07/03/2007   Td 08/02/2004   Tdap 05/20/2011   Zoster, Live 01/04/2013    Screening Tests Health Maintenance  Topic Date Due   Zoster Vaccines- Shingrix (1 of 2) 04/25/1992   DTaP/Tdap/Td (3 - Td or Tdap) 05/19/2021   FOOT EXAM  11/04/2023   OPHTHALMOLOGY EXAM  11/26/2023   DEXA SCAN  11/01/2024 (Originally 06/15/2022)   INFLUENZA VACCINE  04/01/2024   HEMOGLOBIN A1C  05/04/2024   Diabetic kidney evaluation - eGFR measurement  11/01/2024   Diabetic kidney evaluation - Urine ACR  11/01/2024   Medicare Annual Wellness (AWV)  12/28/2024   Pneumonia Vaccine 63+ Years old  Completed   HPV VACCINES  Aged Out   Meningococcal B Vaccine  Aged Out   COVID-19 Vaccine  Discontinued    Health Maintenance  Health Maintenance Due  Topic Date Due   Zoster Vaccines- Shingrix (1 of 2) 04/25/1992   DTaP/Tdap/Td (3 - Td or Tdap) 05/19/2021   FOOT EXAM  11/04/2023   OPHTHALMOLOGY EXAM  11/26/2023   Health Maintenance Items Addressed: DM LABS ORDERED; UP TO DATE ON MAMMOGRAM; UP TO DATE ON SHOTS, ENCOURAGED TO GET SHINGRIX; NEEDS EYE EXAM  Additional Screening:  Vision Screening: Recommended annual ophthalmology exams for early detection of glaucoma and other disorders of the eye.  Dental Screening: Recommended annual dental exams for proper oral hygiene  Community Resource Referral / Chronic Care Management: CRR required this visit?  No   CCM required this visit?  No     Plan:     I have personally reviewed and noted the following in the patient's chart:   Medical and social history Use of alcohol, tobacco or illicit drugs  Current medications and supplements including opioid prescriptions. Patient is not currently taking opioid prescriptions. Functional ability and status Nutritional status Physical activity Advanced directives List of other physicians Hospitalizations, surgeries, and ER visits in previous 12  months Vitals Screenings to include cognitive, depression, and falls Referrals and appointments  In addition, I have reviewed and discussed with patient certain preventive protocols, quality metrics, and best practice recommendations. A written personalized care plan for preventive services as well as general preventive health recommendations were provided to patient.     Pinky Bright, LPN   [id number]   After Visit Summary: (MyChart) Due to this being a telephonic visit, the after visit summary with patients personalized plan was offered to patient via MyChart   Notes:  DM LABS ORDERED

## 2023-12-29 NOTE — Patient Instructions (Addendum)
 Ms. Laack , Thank you for taking time to come for your Medicare Wellness Visit. I appreciate your ongoing commitment to your health goals. Please review the following plan we discussed and let me know if I can assist you in the future.   Referrals/Orders/Follow-Ups/Clinician Recommendations: NONE- NEED EYE EXAM  This is a list of the screening recommended for you and due dates:  Health Maintenance  Topic Date Due   Zoster (Shingles) Vaccine (1 of 2) 04/25/1992   DTaP/Tdap/Td vaccine (3 - Td or Tdap) 05/19/2021   Complete foot exam   11/04/2023   Eye exam for diabetics  11/26/2023   DEXA scan (bone density measurement)  11/01/2024*   Flu Shot  04/01/2024   Hemoglobin A1C  05/04/2024   Yearly kidney function blood test for diabetes  11/01/2024   Yearly kidney health urinalysis for diabetes  11/01/2024   Medicare Annual Wellness Visit  12/28/2024   Pneumonia Vaccine  Completed   HPV Vaccine  Aged Out   Meningitis B Vaccine  Aged Out   COVID-19 Vaccine  Discontinued  *Topic was postponed. The date shown is not the original due date.    Advanced directives: (ACP Link)Information on Advanced Care Planning can be found at [place]  Secretary of [facility] Advance Health Care Directives Advance Health Care Directives. [email or web address]   Next Medicare Annual Wellness Visit scheduled for next year: Yes  01/03/25 @ 1:50 PM BY PHONE

## 2023-12-30 DIAGNOSIS — I10 Essential (primary) hypertension: Secondary | ICD-10-CM | POA: Diagnosis not present

## 2023-12-30 DIAGNOSIS — N1831 Chronic kidney disease, stage 3a: Secondary | ICD-10-CM | POA: Diagnosis not present

## 2023-12-30 DIAGNOSIS — E785 Hyperlipidemia, unspecified: Secondary | ICD-10-CM | POA: Diagnosis not present

## 2023-12-30 DIAGNOSIS — R809 Proteinuria, unspecified: Secondary | ICD-10-CM | POA: Diagnosis not present

## 2023-12-30 DIAGNOSIS — E1122 Type 2 diabetes mellitus with diabetic chronic kidney disease: Secondary | ICD-10-CM | POA: Diagnosis not present

## 2023-12-31 ENCOUNTER — Telehealth: Payer: Self-pay | Admitting: Family Medicine

## 2023-12-31 DIAGNOSIS — E1165 Type 2 diabetes mellitus with hyperglycemia: Secondary | ICD-10-CM

## 2023-12-31 DIAGNOSIS — E1159 Type 2 diabetes mellitus with other circulatory complications: Secondary | ICD-10-CM

## 2023-12-31 MED ORDER — LOSARTAN POTASSIUM 50 MG PO TABS: 50.0000 mg | ORAL_TABLET | Freq: Every day | ORAL | 3 refills | Status: DC

## 2023-12-31 MED ORDER — GLIPIZIDE ER 5 MG PO TB24: ORAL_TABLET | ORAL | 3 refills | Status: AC

## 2023-12-31 NOTE — Telephone Encounter (Addendum)
 [facility] faxed refill request for the following medications:   losartan  (COZAAR ) 50 MG tablet   see additional refill request below  Please advise.

## 2023-12-31 NOTE — Telephone Encounter (Signed)
 Refilled

## 2023-12-31 NOTE — Telephone Encounter (Signed)
 [facility] is requesting refill glipiZIDE  (GLUCOTROL  XL) 5 MG 24 hr tablet   Please advise

## 2024-02-03 ENCOUNTER — Encounter: Payer: Self-pay | Admitting: Family Medicine

## 2024-02-03 ENCOUNTER — Ambulatory Visit: Admitting: Family Medicine

## 2024-02-03 VITALS — BP 162/58 | HR 67 | Resp 20 | Ht 64.0 in | Wt 193.3 lb

## 2024-02-03 DIAGNOSIS — L821 Other seborrheic keratosis: Secondary | ICD-10-CM

## 2024-02-03 DIAGNOSIS — B379 Candidiasis, unspecified: Secondary | ICD-10-CM | POA: Diagnosis not present

## 2024-02-03 DIAGNOSIS — N183 Chronic kidney disease, stage 3 unspecified: Secondary | ICD-10-CM | POA: Diagnosis not present

## 2024-02-03 DIAGNOSIS — E1122 Type 2 diabetes mellitus with diabetic chronic kidney disease: Secondary | ICD-10-CM | POA: Diagnosis not present

## 2024-02-03 DIAGNOSIS — H60549 Acute eczematoid otitis externa, unspecified ear: Secondary | ICD-10-CM | POA: Diagnosis not present

## 2024-02-03 DIAGNOSIS — B372 Candidiasis of skin and nail: Secondary | ICD-10-CM

## 2024-02-03 MED ORDER — NYSTATIN 100000 UNIT/GM EX POWD: 1.0000 | Freq: Three times a day (TID) | CUTANEOUS | 0 refills | Status: AC

## 2024-02-03 MED ORDER — KETOCONAZOLE 2 % EX SHAM: 1.0000 | MEDICATED_SHAMPOO | CUTANEOUS | 0 refills | Status: AC

## 2024-02-03 MED ORDER — TRIAMCINOLONE ACETONIDE 0.5 % EX OINT: 1.0000 | TOPICAL_OINTMENT | Freq: Two times a day (BID) | CUTANEOUS | 0 refills | Status: AC

## 2024-02-03 NOTE — Patient Instructions (Addendum)
 Recommended vaccines: Tdap (tetanus, diphtheria, and pertussis (whooping cough)).    Check your blood pressure once daily, and any time you have concerning symptoms like headache, chest pain, dizziness, shortness of breath, or vision changes.   Our goal is less than 140/90.  To appropriately check your blood pressure, make sure you do the following:  1) Avoid caffeine, exercise, or tobacco products for 30 minutes before checking. Empty your bladder. 2) Sit with your back supported in a flat-backed chair. Rest your arm on something flat (arm of the chair, table, etc). 3) Sit still with your feet flat on the floor, resting, for at least 5 minutes.  4) Check your blood pressure. Take 1-2 readings.  5) Write down these readings and bring with you to any provider appointments.  Bring your home blood pressure machine with you to a provider's office for accuracy comparison at least once a year.   Make sure you take your blood pressure medications before you come to any office visit, even if you were asked to fast for labs.

## 2024-02-03 NOTE — Progress Notes (Signed)
 Established patient visit   Patient: Danielle Jackson   DOB: 02/24/1941   83 y.o. Female  MRN: [id number] Visit Date: 02/03/2024  Today's healthcare provider: Carlean Charter, DO   Chief Complaint  Patient presents with   Follow-up   Rash    Possible psoriasis with yeast infection noticed a few months ago.    Subjective    Rash   Danielle Jackson is an 83 year old female with diabetes who presents with skin concerns and ear issues.  She has been experiencing patches of dry skin in and behind her ears and on her scalp, as well as redness to several areas of her body including her underarms and under her breasts. The condition may be aggravated by her bra rubbing against her skin. She has been using nystatin  powder from a previous yeast infection to manage symptoms and notes that certain foods seem to worsen her condition, though specific triggers are unidentified.  She reports a scabby spot on her buttocks and a flaky spot on the back of her leg. She has been using nystatin  and triamcinolone  cream, which initially helped, but symptoms have returned. She tries to keep her skin dry, especially in the groin area, using a hairdryer after showering.  She has noticed decreased hearing, attributing it to earwax buildup. Her hairdresser observed her ears were stuffy. She uses a small brush to massage her scalp and washes her hair every two to three days, applying lotion behind her ears, which she finds helpful.  She has a history of low vitamin D  levels and takes a 5000 IU supplement. She spends more time in the sun to help with her vitamin D  levels.  Her blood pressure is well-controlled at home, with readings around 120/70 mmHg. She reports that her kidney doctor told her she is stage 3A and that her condition is stable.  No numbness or tingling in her feet, no swelling observed, and no pain in calves when walking. She reports a bad knee that sometimes hurts. She spends time in the garden  and prefers not to wear a bra at home for comfort.      Medications: Outpatient Medications Prior to Visit  Medication Sig   Azelastine HCl (ASTEPRO) 0.15 % SOLN Place into the nose.   CALCIUM  CARBONATE-VIT D-MIN PO Take by mouth 2 (two) times daily.   chlorpheniramine (CHLOR-TRIMETON) 4 MG tablet Take 4 mg by mouth 2 (two) times daily as needed for allergies.   Cholecalciferol (VITAMIN D ) 2000 UNITS tablet Take by mouth daily.   glipiZIDE  (GLUCOTROL  XL) 5 MG 24 hr tablet TAKE 1 TABLET BY MOUTH ONCE A DAY   ibuprofen (ADVIL,MOTRIN) 200 MG tablet Take by mouth as needed.   losartan  (COZAAR ) 50 MG tablet Take 1 tablet (50 mg total) by mouth daily.   ONETOUCH ULTRA TEST test strip USE ONE STRIP TO CHECK GLUCOSE ONCE DAILY   [DISCONTINUED] nystatin  (MYCOSTATIN /NYSTOP ) powder Apply 1 Application topically 3 (three) times daily.   [DISCONTINUED] triamcinolone  ointment (KENALOG ) 0.5 % Apply 1 Application topically 2 (two) times daily.   atorvastatin  (LIPITOR) 10 MG tablet Take 1 tablet (10 mg total) by mouth daily. (Patient not taking: Reported on 02/03/2024)   No facility-administered medications prior to visit.        Objective    BP (!) 162/58 (BP Location: Left Arm, Patient Position: Sitting, Cuff Size: Normal)   Pulse 67   Resp 20   Ht 5\' 4"  (1.626 m)  Wt 193 lb 4.8 oz (87.7 kg)   SpO2 100%   BMI 33.18 kg/m     Physical Exam Constitutional:      Appearance: Normal appearance.  HENT:     Head: Normocephalic and atraumatic.     Right Ear: Tympanic membrane, ear canal and external ear normal. There is no impacted cerumen.     Left Ear: Tympanic membrane, ear canal and external ear normal. There is no impacted cerumen.  Eyes:     General: No scleral icterus.    Conjunctiva/sclera: Conjunctivae normal.  Cardiovascular:     Pulses:          Dorsalis pedis pulses are 2+ on the right side and 2+ on the left side.       Posterior tibial pulses are 2+ on the right side and 2+  on the left side.  Musculoskeletal:     Right foot: Normal range of motion. No deformity, bunion, Charcot foot, foot drop or prominent metatarsal heads.     Left foot: Normal range of motion. No deformity, bunion, Charcot foot, foot drop or prominent metatarsal heads.  Feet:     Right foot:     Protective Sensation: 10 sites tested.  10 sites sensed.     Skin integrity: No ulcer, blister, skin breakdown, erythema, warmth, callus, dry skin or fissure.     Toenail Condition: Right toenails are normal.     Left foot:     Protective Sensation: 10 sites tested.  10 sites sensed.     Skin integrity: No ulcer, blister, skin breakdown, erythema, warmth, callus, dry skin or fissure.     Toenail Condition: Left toenails are normal.  Skin:    Comments: Reddened patches under arms and breasts bilaterally. Dry, flaky patches behind ears and on scalp.  Neurological:     Mental Status: She is alert and oriented to person, place, and time. Mental status is at baseline.  Psychiatric:        Mood and Affect: Mood normal.        Behavior: Behavior normal.      No results found for any visits on 02/03/24.  Assessment & Plan    Seborrheic keratosis of scalp -     Ketoconazole; Apply 1 Application topically 2 (two) times a week.  Dispense: 120 mL; Refill: 0  Candida infection -     Triamcinolone  Acetonide; Apply 1 Application topically 2 (two) times daily.  Dispense: 30 g; Refill: 0 -     Nystatin ; Apply 1 Application topically 3 (three) times daily.  Dispense: 15 g; Refill: 0  Yeast dermatitis -     Triamcinolone  Acetonide; Apply 1 Application topically 2 (two) times daily.  Dispense: 30 g; Refill: 0 -     Nystatin ; Apply 1 Application topically 3 (three) times daily.  Dispense: 15 g; Refill: 0  Controlled type 2 diabetes mellitus with stage 3 chronic kidney disease, without long-term current use of insulin (HCC)  Eczema of external auditory canal      Type 2 diabetes Well-controlled with  blood sugars reported in the 70s to 100s.  Foot exam performed today.  Will check A1c on next visit.  No changes in management at this time.   Candidiasis of skin Yeast infection in underarms and under breasts.  - Prescribed nystatin  and triamcinolone  cream for affected areas. - Advised to keep affected areas dry, use hair dryer if necessary. - Recommended wearing cotton clothing.  Seborrheic keratosis - Prescribed ketoconazole shampoo for  scalp twice weekly.  Eczema auditory canal Hearing difficulty due to earwax buildup. Advised against Q-tip use. - Recommended mineral oil drops to soften earwax.  Chronic Kidney Disease Stage 3A CKD stage 3A well-managed. Blood pressure controlled at home. - Plan blood work at next visit. - Instructed to bring blood pressure cuff and log to next visit.  General Health Maintenance Due for eye exam. Declined shingles vaccine, considering Tdap vaccine. - Recommended scheduling eye exam. - Documented Tdap vaccine recommendation in after visit summary.  Follow-up Follow-up in three months for evaluation and management. - Schedule follow-up appointment in three months.   Return in about 3 months (around 05/05/2024) for DM, Chronic f/u.      I discussed the assessment and treatment plan with the patient  The patient was provided an opportunity to ask questions and all were answered. The patient agreed with the plan and demonstrated an understanding of the instructions.   The patient was advised to call back or seek an in-person evaluation if the symptoms worsen or if the condition fails to improve as anticipated.    Carlean Charter, DO  [facility] [phone] (phone) [phone] (fax)  [facility]

## 2024-03-11 ENCOUNTER — Ambulatory Visit: Admitting: Family Medicine

## 2024-03-11 ENCOUNTER — Ambulatory Visit: Payer: Self-pay | Admitting: *Deleted

## 2024-03-11 VITALS — BP 156/68 | HR 66 | Ht 64.0 in | Wt 190.0 lb

## 2024-03-11 DIAGNOSIS — I152 Hypertension secondary to endocrine disorders: Secondary | ICD-10-CM | POA: Diagnosis not present

## 2024-03-11 DIAGNOSIS — R197 Diarrhea, unspecified: Secondary | ICD-10-CM

## 2024-03-11 DIAGNOSIS — K5792 Diverticulitis of intestine, part unspecified, without perforation or abscess without bleeding: Secondary | ICD-10-CM

## 2024-03-11 DIAGNOSIS — E1159 Type 2 diabetes mellitus with other circulatory complications: Secondary | ICD-10-CM | POA: Diagnosis not present

## 2024-03-11 DIAGNOSIS — R1084 Generalized abdominal pain: Secondary | ICD-10-CM | POA: Diagnosis not present

## 2024-03-11 MED ORDER — LOSARTAN POTASSIUM 50 MG PO TABS: 50.0000 mg | ORAL_TABLET | Freq: Two times a day (BID) | ORAL | 0 refills | Status: DC

## 2024-03-11 MED ORDER — CIPROFLOXACIN HCL 500 MG PO TABS: 500.0000 mg | ORAL_TABLET | Freq: Two times a day (BID) | ORAL | 0 refills | Status: AC

## 2024-03-11 NOTE — Telephone Encounter (Signed)
 FYI Only or Action Required?: FYI only for provider.  Patient was last seen in primary care on 02/03/2024 by Donzella Lauraine SAILOR, DO.  Called Nurse Triage reporting Hypertension and Blood pressure update.  Symptoms began several days ago.  Interventions attempted: OTC medications: asa, imodium  and Rest, hydration, or home remedies.  Symptoms are: gradually improving.  Triage Disposition: See Physician Within 24 Hours  Patient/caregiver understands and will follow disposition?: Yes   930am 160/95, 64. She is asymptomatic at this time. She also took 2 aspirin this morning.  She is having ongoing abdominal pain, wondering if she has diverticulitis. Lower back pain.  Reason for Disposition  [1] MILD pain (e.g., does not interfere with normal activities) AND [2] pain comes and goes (cramps) AND [3] present > 48 hours  (Exception: This same abdominal pain is a chronic symptom recurrent or ongoing AND present > 4 weeks.)  Answer Assessment - Initial Assessment Questions 1. LOCATION: Where does it hurt?      Lower abdomen 2. RADIATION: Does the pain shoot anywhere else? (e.g., chest, back)     Lower back 3. ONSET: When did the pain begin? (e.g., minutes, hours or days ago)      Several days ago 4. SUDDEN: Gradual or sudden onset?     gradual 5. PATTERN Does the pain come and go, or is it constant?     intermittent 6. SEVERITY: How bad is the pain?  (e.g., Scale 1-10; mild, moderate, or severe)     Mild 7. RECURRENT SYMPTOM: Have you ever had this type of stomach pain before? If Yes, ask: When was the last time? and What happened that time?      yes 8. CAUSE: What do you think is causing the stomach pain? (e.g., gallstones, recent abdominal surgery)     Unsure, diverticulitis' 9. RELIEVING/AGGRAVATING FACTORS: What makes it better or worse? (e.g., antacids, bending or twisting motion, bowel movement)     BM improves pain 10. OTHER SYMPTOMS: Do you have any other  symptoms? (e.g., back pain, diarrhea, fever, urination pain, vomiting)       Lower back  Additional info: Patient triaged earlier today for hypertension and was placed in call back for repeat bp, this writer spoke with patient her blood pressure has reduced to 160/95, 64 from 197/100. She denies all other symptoms except abdominal & back pain. Scheduled acute today with alternate provider.  Protocols used: Abdominal Pain - Female-A-AH

## 2024-03-11 NOTE — Progress Notes (Signed)
 ACUTE VISIT   Patient: Danielle Jackson   DOB: 1941/07/15   83 y.o. Female  MRN: [id number]   PCP: Donzella Lauraine SAILOR, DO  Chief Complaint  Patient presents with   Abdominal Pain    Patient is present due to lower abdominal pain radiating to lower back associated with associated with diarrhea. She reports intermittent mild pain that is recurrent. She believes it could possibly be diverticulitis. Bowel movements helps improve her pain. She reports it is a little better after taking more imodium and eating a soft diet (mashed potatoes)   Hypertension    Patient also contacted office this morning due to elevated blood pressure reading of 197/100 that eventually reduced to 160/95 Reports checking again at 2 pm and it was 155/72.    Subjective    HPI HPI     Abdominal Pain    Additional comments: Patient is present due to lower abdominal pain radiating to lower back associated with associated with diarrhea. She reports intermittent mild pain that is recurrent. She believes it could possibly be diverticulitis. Bowel movements helps improve her pain. She reports it is a little better after taking more imodium and eating a soft diet (mashed potatoes)        Hypertension    Additional comments: Patient also contacted office this morning due to elevated blood pressure reading of 197/100 that eventually reduced to 160/95 Reports checking again at 2 pm and it was 155/72.       Last edited by Lilian Fitzpatrick, CMA on 03/11/2024  4:06 PM.       Discussed the use of AI scribe software for clinical note transcription with the patient, who gave verbal consent to proceed.  History of Present Illness Danielle Jackson is an 83 year old female with hypertension and diabetes who presents with abdominal pain and diarrhea.  She experiences mild, recurrent lower abdominal pain radiating to her lower back, accompanied by intermittent diarrhea. Bowel movements provide some relief. She manages her  symptoms with Imodium and a soft diet, including mashed potatoes, which she feels alleviates her discomfort. She noticed a black streak in her stool after taking Pepto-Bismol, attributing it to the medication.  She has a history of diverticulosis, confirmed by a previous colonoscopy, and was hospitalized in the early 2000s for severe diverticulitis, requiring a three-day stay. During that hospitalization, the condition affected her entire colon.  She reports elevated blood pressure readings, with a recent measurement of 187/100 mmHg. Her current medication regimen includes losartan  50 mg daily, which she has recently adjusted to 50 mg twice daily. She has been taking coated aspirin to help keep her blood thinner.  Her brother and deceased sister both experienced similar gastrointestinal issues, raising the possibility of a genetic component.  She has not been on antibiotics recently, except for a UTI earlier in the year, and she does not recall significant findings from that episode.  She has a history of skin issues, including a yeast infection and a breakout on her leg, which she manages with gentle soap and lotion. She is concerned about a scaly spot on her leg and has difficulty accessing dermatology care.  She has a history of knee surgery, where cartilage was removed, and she prefers to avoid further surgical interventions.  Medications: Outpatient Medications Prior to Visit  Medication Sig   atorvastatin  (LIPITOR) 10 MG tablet Take 1 tablet (10 mg total) by mouth daily.   Azelastine HCl (ASTEPRO) 0.15 % SOLN  Place into the nose.   CALCIUM  CARBONATE-VIT D-MIN PO Take by mouth 2 (two) times daily.   Cholecalciferol (VITAMIN D ) 2000 UNITS tablet Take by mouth daily.   glipiZIDE  (GLUCOTROL  XL) 5 MG 24 hr tablet TAKE 1 TABLET BY MOUTH ONCE A DAY   ibuprofen (ADVIL,MOTRIN) 200 MG tablet Take by mouth as needed.   ketoconazole  (NIZORAL ) 2 % shampoo Apply 1 Application topically 2 (two) times a  week.   nystatin  (MYCOSTATIN /NYSTOP ) powder Apply 1 Application topically 3 (three) times daily.   ONETOUCH ULTRA TEST test strip USE ONE STRIP TO CHECK GLUCOSE ONCE DAILY   triamcinolone  ointment (KENALOG ) 0.5 % Apply 1 Application topically 2 (two) times daily.   [DISCONTINUED] losartan  (COZAAR ) 50 MG tablet Take 1 tablet (50 mg total) by mouth daily.   chlorpheniramine (CHLOR-TRIMETON) 4 MG tablet Take 4 mg by mouth 2 (two) times daily as needed for allergies.   No facility-administered medications prior to visit.    Last metabolic panel Lab Results  Component Value Date   GLUCOSE 156 (H) 11/02/2023   NA 141 11/02/2023   K 4.4 11/02/2023   CL 105 11/02/2023   CO2 23 11/02/2023   BUN 15 11/02/2023   CREATININE 1.24 (H) 11/02/2023   EGFR 44 (L) 11/02/2023   CALCIUM  9.4 11/02/2023   PHOS 3.6 05/05/2017   PROT 6.7 11/02/2023   ALBUMIN 3.9 11/02/2023   LABGLOB 2.8 11/02/2023   AGRATIO 1.7 11/04/2022   BILITOT 0.5 11/02/2023   ALKPHOS 103 11/02/2023   AST 19 11/02/2023   ALT 16 11/02/2023   ANIONGAP 10 09/03/2018   Last lipids Lab Results  Component Value Date   CHOL 197 11/02/2023   HDL 40 11/02/2023   LDLCALC 114 (H) 11/02/2023   TRIG 248 (H) 11/02/2023   CHOLHDL 4.9 (H) 11/02/2023   Last hemoglobin A1c Lab Results  Component Value Date   HGBA1C 6.9 (H) 11/02/2023   Last thyroid  functions Lab Results  Component Value Date   TSH 1.800 05/05/2023        Objective    BP (!) 156/68 (BP Location: Right Arm, Patient Position: Sitting, Cuff Size: Normal)   Pulse 66   Ht 5' 4 (1.626 m)   Wt 190 lb (86.2 kg)   SpO2 100%   BMI 32.61 kg/m  BP Readings from Last 3 Encounters:  03/11/24 (!) 156/68  02/03/24 (!) 162/58  11/02/23 (!) 160/63   Wt Readings from Last 3 Encounters:  03/11/24 190 lb (86.2 kg)  02/03/24 193 lb 4.8 oz (87.7 kg)  11/02/23 190 lb 8 oz (86.4 kg)      Physical Exam   Physical Exam VITALS: BP- 156/68 GENERAL: No signs of  dehydration. ABDOMEN: Significant tenderness in left lower quadrant, abdomen soft, non-distended.   No results found for any visits on 03/11/24.   Assessment & Plan      Assessment & Plan Acute Diverticulitis Intermittent mild lower abdominal pain radiating to the lower back, associated with diarrhea. Diverticulosis confirmed by colonoscopy and previous hospitalization for severe diverticulitis. Current symptoms and elevated white blood cell count suggest acute diverticulitis. Managing symptoms with Imodium and a soft diet. Risks of ciprofloxacin  include potential C. difficile infection, so a short course is recommended. Encouraged to maintain hydration and a BRAT diet to support recovery. - Order CBC, complete metabolic panel, and lipase level - Prescribe ciprofloxacin  500 mg twice daily for 3 days - Advise continuation of soft diet and hydration - Recommend BRAT diet (bananas, rice,  applesauce, toast) - Encourage consumption of yogurt to support gut flora  Hypertension Elevated blood pressure readings, with a recent measurement of 187/100 mmHg. Currently on losartan  50 mg daily. Plan to increase dosage to better manage blood pressure. She has been taking additional aspirin, which is not recommended to continue. Discussed increasing losartan  to 50 mg twice daily and monitoring blood pressure response. - Increase losartan  to 50 mg twice daily - Advise against continued use of aspirin for blood pressure management - Schedule follow-up for blood pressure in 2 weeks  Dermatological Concerns Reports of skin issues, including a breakout and a scaly spot on the back of the leg. Previous assessment suggested a possible yeast infection. She prefers to wait before pursuing dermatology referral. - Consider dermatology referral if symptoms persist      Return in about 2 weeks (around 03/25/2024) for HTN & abdominal pain .        Rockie Agent, MD  [facility] [phone] (phone) [phone] (fax)  [facility]

## 2024-03-11 NOTE — Telephone Encounter (Signed)
    FYI Only or Action Required?: Action required by provider: request for appointment and clinical question for provider.  Patient was last seen in primary care on 02/03/2024 by Donzella Lauraine SAILOR, DO.  Called Nurse Triage reporting Hypertension.  Symptoms began several days ago.  Interventions attempted: Prescription medications: took extra losartan  last night and has been taking imodium /pepto bismol for diarrhea.  Symptoms are: unchanged.  Triage Disposition: See Physician Within 24 Hours  Patient/caregiver understands and will follow disposition?: No, wishes to speak with PCP                Copied from CRM [id number]. Topic: Clinical - Red Word Triage >> Mar 11, 2024  8:05 AM Danielle Jackson wrote: Red Word that prompted transfer to Nurse Triage: patient blood pressure is getting high, diarrhea and pain in back and left abdomin, headache    ----------------------------------------------------------------------- From previous Reason for Contact - Scheduling: Patient/patient representative is calling to schedule an appointment. Refer to attachments for appointment information. Reason for Disposition  Systolic BP >= 180 OR Diastolic >= 110  Answer Assessment - Initial Assessment Questions No available appt with PCP or other providers until July 21. Patient taking extra losartan  last night and BP continues to be elevated. No sx at this time but has had sx in the past. Will call patient back in 1 hour to recheck BP . Reports just taking morning dose of losartan . Recommended UC if pain in abdomen continues or BP remains elevated      1. BLOOD PRESSURE: What is your blood pressure? Did you take at least two measurements 5 minutes apart?     BP 197/100 pulse 68  rechecked for BP 170/94 pulse 66 2. ONSET: When did you take your blood pressure?     Now  3. HOW: How did you take your blood pressure? (e.g., automatic home BP monitor, visiting nurse)     Wrist monitor .  Reports having trouble with other monitor 4. HISTORY: Do you have a history of high blood pressure?     Yes  5. MEDICINES: Are you taking any medicines for blood pressure? Have you missed any doses recently?     Yes losartan  and took extra for approx 2 days  6. OTHER SYMPTOMS: Do you have any symptoms? (e.g., blurred vision, chest pain, difficulty breathing, headache, weakness)     Chest felt funny the other day denies now, headache in the past few days but denies now. Sx of diarrhea x 3 in past 24 hours and left abdominal pain and back pain. 7. PREGNANCY: Is there any chance you are pregnant? When was your last menstrual period?     na  Protocols used: Blood Pressure - High-A-AH

## 2024-03-12 LAB — CBC
Hematocrit: 38.5 % (ref 34.0–46.6)
Hemoglobin: 12.9 g/dL (ref 11.1–15.9)
MCH: 33.2 pg — ABNORMAL HIGH (ref 26.6–33.0)
MCHC: 33.5 g/dL (ref 31.5–35.7)
MCV: 99 fL — ABNORMAL HIGH (ref 79–97)
Platelets: 196 x10E3/uL (ref 150–450)
RBC: 3.88 x10E6/uL (ref 3.77–5.28)
RDW: 13 % (ref 11.7–15.4)
WBC: 7.3 x10E3/uL (ref 3.4–10.8)

## 2024-03-12 LAB — CMP14+EGFR
ALT: 17 IU/L (ref 0–32)
AST: 26 IU/L (ref 0–40)
Albumin: 4 g/dL (ref 3.7–4.7)
Alkaline Phosphatase: 83 IU/L (ref 44–121)
BUN/Creatinine Ratio: 20 (ref 12–28)
BUN: 26 mg/dL (ref 8–27)
Bilirubin Total: 0.4 mg/dL (ref 0.0–1.2)
CO2: 18 mmol/L — ABNORMAL LOW (ref 20–29)
Calcium: 9.7 mg/dL (ref 8.7–10.3)
Chloride: 107 mmol/L — ABNORMAL HIGH (ref 96–106)
Creatinine, Ser: 1.27 mg/dL — ABNORMAL HIGH (ref 0.57–1.00)
Globulin, Total: 2.5 g/dL (ref 1.5–4.5)
Glucose: 176 mg/dL — ABNORMAL HIGH (ref 70–99)
Potassium: 4.4 mmol/L (ref 3.5–5.2)
Sodium: 141 mmol/L (ref 134–144)
Total Protein: 6.5 g/dL (ref 6.0–8.5)
eGFR: 42 mL/min/1.73 — ABNORMAL LOW (ref >59)

## 2024-03-12 LAB — LIPASE: Lipase: 80 U/L (ref 14–85)

## 2024-03-15 ENCOUNTER — Ambulatory Visit: Payer: Self-pay | Admitting: Family Medicine

## 2024-05-06 ENCOUNTER — Encounter: Payer: Self-pay | Admitting: Family Medicine

## 2024-05-06 ENCOUNTER — Ambulatory Visit: Admitting: Family Medicine

## 2024-05-06 ENCOUNTER — Ambulatory Visit: Payer: Self-pay | Admitting: Family Medicine

## 2024-05-06 ENCOUNTER — Telehealth: Payer: Self-pay

## 2024-05-06 VITALS — BP 149/53 | HR 67 | Ht 64.0 in | Wt 191.0 lb

## 2024-05-06 DIAGNOSIS — R3121 Asymptomatic microscopic hematuria: Secondary | ICD-10-CM | POA: Diagnosis not present

## 2024-05-06 DIAGNOSIS — E1122 Type 2 diabetes mellitus with diabetic chronic kidney disease: Secondary | ICD-10-CM

## 2024-05-06 DIAGNOSIS — E1159 Type 2 diabetes mellitus with other circulatory complications: Secondary | ICD-10-CM | POA: Diagnosis not present

## 2024-05-06 DIAGNOSIS — R300 Dysuria: Secondary | ICD-10-CM

## 2024-05-06 DIAGNOSIS — N183 Chronic kidney disease, stage 3 unspecified: Secondary | ICD-10-CM | POA: Diagnosis not present

## 2024-05-06 DIAGNOSIS — R809 Proteinuria, unspecified: Secondary | ICD-10-CM

## 2024-05-06 DIAGNOSIS — I152 Hypertension secondary to endocrine disorders: Secondary | ICD-10-CM | POA: Diagnosis not present

## 2024-05-06 LAB — POCT URINALYSIS DIPSTICK
Bilirubin, UA: NEGATIVE
Glucose, UA: NEGATIVE
Ketones, UA: NEGATIVE
Leukocytes, UA: NEGATIVE
Nitrite, UA: NEGATIVE
Protein, UA: POSITIVE — AB
Spec Grav, UA: >=1.03 — AB (ref 1.010–1.025)
Urobilinogen, UA: 2 U/dL — AB
pH, UA: 6 (ref 5.0–8.0)

## 2024-05-06 LAB — POCT GLYCOSYLATED HEMOGLOBIN (HGB A1C): Hemoglobin A1C: 6.2 % — AB (ref 4.0–5.6)

## 2024-05-06 MED ORDER — MINOXIDIL 2.5 MG PO TABS: 2.5000 mg | ORAL_TABLET | Freq: Every day | ORAL | 0 refills | Status: DC

## 2024-05-06 NOTE — Telephone Encounter (Signed)
 Called and spoke with Gracie from [facility], to inform her per Dr Donzella please cancel/discontinue Miaoxidil, she stated she would make sure to D/C

## 2024-05-06 NOTE — Progress Notes (Signed)
 Established patient visit   Patient: Danielle Jackson   DOB: September 01, 1941   83 y.o. Female  MRN: [id number] Visit Date: 05/06/2024  Today's healthcare provider: LAURAINE LOISE BUOY, DO   Chief Complaint  Patient presents with   Diabetes   Subjective    Diabetes Pertinent negatives for hypoglycemia include no headaches. Pertinent negatives for diabetes include no chest pain and no weakness.   Jacquelyn Shadrick Brame is an 83 year old female with diabetes who presents for a follow-up visit.  Blood sugar levels have been stable, with readings ranging from 89 to 122 mg/dL. No hypoglycemic episodes have occurred.  She recently experienced symptoms she associates with urinary tract infection and began taking D-mannose last week, which has alleviated her symptoms (headaches, leg pain, and dysuria). She reports improvement and no current urinary pain.  She has a history of elevated cholesterol and stopped taking atorvastatin  about three to four months ago. Her LDL cholesterol increased from 87 to 114 mg/dL.  She experiences knee pain, particularly after walking, due to arthritis and previous cartilage removal. No numbness or tingling is present.  She has a history of diverticulitis, which was previously successfully treated in July of this year.  She manages her blood pressure with losartan  and checks her blood pressure at home, but has not done so in the past week or two.  She has a history of dry skin on her scalp and behind her ears, including flaking in her scalp and ears. She uses a prescribed shampoo and has noticed some improvement in itching.  She plans to follow-up with a dermatologist for further evaluation and recommendations.  She plans to get a flu shot later in October.       Medications: Outpatient Medications Prior to Visit  Medication Sig Note   Azelastine HCl (ASTEPRO) 0.15 % SOLN Place into the nose.    CALCIUM  CARBONATE-VIT D-MIN PO Take by mouth 2 (two) times daily.     Cholecalciferol (VITAMIN D ) 2000 UNITS tablet Take by mouth daily.    glipiZIDE  (GLUCOTROL  XL) 5 MG 24 hr tablet TAKE 1 TABLET BY MOUTH ONCE A DAY    ibuprofen (ADVIL,MOTRIN) 200 MG tablet Take by mouth as needed.    ketoconazole  (NIZORAL ) 2 % shampoo Apply 1 Application topically 2 (two) times a week.    losartan  (COZAAR ) 50 MG tablet Take 1 tablet (50 mg total) by mouth 2 (two) times daily.    nystatin  (MYCOSTATIN /NYSTOP ) powder Apply 1 Application topically 3 (three) times daily.    ONETOUCH ULTRA TEST test strip USE ONE STRIP TO CHECK GLUCOSE ONCE DAILY    triamcinolone  ointment (KENALOG ) 0.5 % Apply 1 Application topically 2 (two) times daily.    [DISCONTINUED] atorvastatin  (LIPITOR) 10 MG tablet Take 1 tablet (10 mg total) by mouth daily. 05/06/2024: stopped 3-4 months ago   [DISCONTINUED] chlorpheniramine (CHLOR-TRIMETON) 4 MG tablet Take 4 mg by mouth 2 (two) times daily as needed for allergies.    No facility-administered medications prior to visit.    Review of Systems  Respiratory: Negative.  Negative for cough, shortness of breath and wheezing.   Cardiovascular:  Negative for chest pain, palpitations and leg swelling.  Musculoskeletal:  Positive for arthralgias (right knee, intermittent pain at baseline).  Neurological:  Negative for weakness and headaches.        Objective    BP (!) 149/53   Pulse 67   Ht 5' 4 (1.626 m)   Wt 191 lb (86.6  kg)   SpO2 99%   BMI 32.79 kg/m     Physical Exam Constitutional:      Appearance: Normal appearance.  HENT:     Head: Normocephalic and atraumatic.  Eyes:     General: No scleral icterus.    Extraocular Movements: Extraocular movements intact.     Conjunctiva/sclera: Conjunctivae normal.  Cardiovascular:     Rate and Rhythm: Normal rate and regular rhythm.     Pulses: Normal pulses.     Heart sounds: Normal heart sounds.  Pulmonary:     Effort: Pulmonary effort is normal. No respiratory distress.     Breath sounds:  Normal breath sounds.  Abdominal:     General: Bowel sounds are normal. There is no distension.     Palpations: Abdomen is soft. There is no mass.     Tenderness: There is no abdominal tenderness. There is no guarding.  Musculoskeletal:     Right lower leg: No edema.     Left lower leg: No edema.  Skin:    General: Skin is warm and dry.  Neurological:     Mental Status: She is alert and oriented to person, place, and time. Mental status is at baseline.  Psychiatric:        Mood and Affect: Mood normal.        Behavior: Behavior normal.      Results for orders placed or performed in visit on 05/06/24  POCT HgB A1C  Result Value Ref Range   Hemoglobin A1C 6.2 (A) 4.0 - 5.6 %   HbA1c POC (<> result, manual entry)     HbA1c, POC (prediabetic range)     HbA1c, POC (controlled diabetic range)    POCT urinalysis dipstick  Result Value Ref Range   Color, UA yellow    Clarity, UA clear    Glucose, UA Negative Negative   Bilirubin, UA neg    Ketones, UA neg    Spec Grav, UA >=1.030 (A) 1.010 - 1.025   Blood, UA small    pH, UA 6.0 5.0 - 8.0   Protein, UA Positive (A) Negative   Urobilinogen, UA 2.0 (A) 0.2 or 1.0 E.U./dL   Nitrite, UA neg    Leukocytes, UA Negative Negative   Appearance     Odor      Assessment & Plan    Controlled type 2 diabetes mellitus with stage 3 chronic kidney disease, without long-term current use of insulin (HCC) -     POCT glycosylated hemoglobin (Hb A1C) -     Microalbumin / creatinine urine ratio  Hypertension associated with diabetes (HCC)  Proteinuria, unspecified type -     Microalbumin / creatinine urine ratio  Dysuria -     POCT urinalysis dipstick  Asymptomatic microscopic hematuria     Type 2 diabetes mellitus with diabetic chronic kidney disease Blood glucose well-controlled, no hypoglycemia.  Nephrology follow-up scheduled.  POC A1c 6.2 today. - Recheck uACR today. - Continue nephrology follow-up in  November.  Hypertension Chronic, stable.  Mildly elevated today at 149/53, likely due to patient taking her antihypertensives just prior to coming to clinic.  Blood pressure improved with additional losartan  (50 mg now BID as of 03/2024).  Home monitoring ongoing. - Continue losartan  as prescribed. - Monitor blood pressure at home regularly.  Hyperlipidemia Discontinuation of atorvastatin  increased LDL from 87 to 114 mg/dL. Discussed importance of maintaining LDL under 70 mg/dL.  Given patient's advanced age, higher lipid levels are potenitally ) patient  wants to try a natural cholesterol medication but does not remember the name to be able to discuss today.   - Monitor cholesterol levels.  Dysuria Patient experienced some dysuria along with headaches and leg pain which she associates with UTIs.  Point-of-care urinalysis ordered and found to be negative for acute urinary tract infection.  Advised patient to continue d-mannose during her visit and followed up via MyChart to advise her that the UA was negative.  Patient to follow-up if symptoms recur.    Return in about 6 months (around 11/03/2024) for CPE, Chronic f/u.      I discussed the assessment and treatment plan with the patient  The patient was provided an opportunity to ask questions and all were answered. The patient agreed with the plan and demonstrated an understanding of the instructions.   The patient was advised to call back or seek an in-person evaluation if the symptoms worsen or if the condition fails to improve as anticipated.    LAURAINE LOISE BUOY, DO  [facility] [phone] (phone) [phone] (fax)  [facility]

## 2024-05-07 LAB — MICROALBUMIN / CREATININE URINE RATIO
Creatinine, Urine: 144.6 mg/dL
Microalb/Creat Ratio: 1001 mg/g{creat} — ABNORMAL HIGH (ref 0–29)
Microalbumin, Urine: 1447.8 ug/mL

## 2024-06-07 DIAGNOSIS — H60333 Swimmer's ear, bilateral: Secondary | ICD-10-CM | POA: Diagnosis not present

## 2024-06-07 DIAGNOSIS — H6063 Unspecified chronic otitis externa, bilateral: Secondary | ICD-10-CM | POA: Diagnosis not present

## 2024-06-17 ENCOUNTER — Other Ambulatory Visit: Payer: Self-pay | Admitting: Family Medicine

## 2024-06-17 DIAGNOSIS — E1159 Type 2 diabetes mellitus with other circulatory complications: Secondary | ICD-10-CM

## 2024-06-20 ENCOUNTER — Ambulatory Visit

## 2024-06-20 DIAGNOSIS — D489 Neoplasm of uncertain behavior, unspecified: Secondary | ICD-10-CM

## 2024-06-20 DIAGNOSIS — D2261 Melanocytic nevi of right upper limb, including shoulder: Secondary | ICD-10-CM | POA: Diagnosis not present

## 2024-06-20 DIAGNOSIS — L219 Seborrheic dermatitis, unspecified: Secondary | ICD-10-CM

## 2024-06-20 DIAGNOSIS — L409 Psoriasis, unspecified: Secondary | ICD-10-CM

## 2024-06-20 DIAGNOSIS — R21 Rash and other nonspecific skin eruption: Secondary | ICD-10-CM | POA: Diagnosis not present

## 2024-06-20 DIAGNOSIS — D239 Other benign neoplasm of skin, unspecified: Secondary | ICD-10-CM

## 2024-06-20 HISTORY — DX: Other benign neoplasm of skin, unspecified: D23.9

## 2024-06-20 MED ORDER — KETOCONAZOLE 2 % EX CREA: TOPICAL_CREAM | CUTANEOUS | 5 refills | Status: AC

## 2024-06-20 MED ORDER — CLOBETASOL PROPIONATE 0.05 % EX OINT: TOPICAL_OINTMENT | CUTANEOUS | 5 refills | Status: AC

## 2024-06-20 NOTE — Progress Notes (Signed)
    Subjective   Danielle Jackson is a 83 y.o. female who presents for the following: Rash. Patient is new patient  Today patient reports: Areas of concern around her bilateral ears and in her scalp. She also complains of areas of concern on her back and legs/ She states the scaling/rash started in February and it  is consistently worsening.   Review of Systems:    No other skin or systemic complaints except as noted in HPI or Assessment and Plan.  The following portions of the chart were reviewed this encounter and updated as appropriate: medications, allergies, medical history  Relevant Medical History:  n/a   Objective  Well appearing patient in no apparent distress; mood and affect are within normal limits. Examination was performed of the: Waist Up Skin Exam: scalp, head, eyes, ears, nose, lips, neck, chest, axillae, upper extremities, abdomen, back, hands, fingers, fingernails   Examination notable for: buttocks with faint tan scaly plaques - well circumscribed  Xerotic  Posterior ears with erythema and scale  Examination limited by: Clothing and Patient deferred removal     Right Forearm 4mm dark brown macule   Assessment & Plan   Psoriasis vs nummular dermatitis back/bottocks Psoriasis/sebopsoriasis ears  - Differential diagnosis, treatment options, prognosis, risk/ benefit, and side effects of treatment were discussed with the patient.  - Start clobetasol ointment 0.05% twice daily to affected skin Discussed side effect of super potent topical steroids including atrophy, dyspigmentation, striae, telangectasia, folliculitis, loss of skin pigment, hair growth, tachyphylaxis, risk of systemic absorption with missuse. -  Start ketoconazole  cream 2% apply topically twice daily as needed for rash on ears  - Recommend OTC Amlactin apply topically daily   Chronic and persistent condition with duration or expected duration over one year. Condition is symptomatic and bothersome to  patient. Patient is flaring and not currently at treatment goal.   Procedures, orders, diagnosis for this visit:  NEOPLASM OF UNCERTAIN BEHAVIOR Right Forearm Skin / nail biopsy Type of biopsy: tangential   Informed consent: discussed and consent obtained   Timeout: patient name, date of birth, surgical site, and procedure verified   Procedure prep:  Patient was prepped and draped in usual sterile fashion Prep type:  Isopropyl alcohol Anesthesia: the lesion was anesthetized in a standard fashion   Anesthetic:  1% lidocaine  w/ epinephrine 1-100,000 buffered w/ 8.4% NaHCO3 Instrument used: DermaBlade   Hemostasis achieved with: pressure and aluminum chloride   Outcome: patient tolerated procedure well   Post-procedure details: sterile dressing applied and wound care instructions given   Dressing type: bandage and petrolatum    RASH AND OTHER NONSPECIFIC SKIN ERUPTION    Neoplasm of uncertain behavior -     Skin / nail biopsy  Rash and other nonspecific skin eruption  Other orders -     Clobetasol Propionate; Apply 1 gram topically to affected area of skin twice daily. Stop once resolved and restart as needed for flares. Avoid use on face, armpits, groin unless otherwise indicated.  Dispense: 60 g; Refill: 5 -     Ketoconazole ; Apply to affected area of skin twice daily  Dispense: 30 g; Refill: 5    Return to clinic: Return for 6-8 weeks rash.  I, Emerick Ege, CMA am acting as scribe for Lauraine JAYSON Kanaris, MD   Documentation: I have reviewed the above documentation for accuracy and completeness, and I agree with the above.  Lauraine JAYSON Kanaris, MD

## 2024-06-20 NOTE — Patient Instructions (Addendum)
 Steroid Use  We prescribed you a topical steroid at today's visit.   General application instructions: -Apply this to any affected skin areas, twice (2 times) daily, for two (2) weeks -If the areas are better, you can stop -Re-start the topical steroid if the areas come back, or flare -If the areas don't get better after two weeks, we sometimes recommend taking a break for one (1) week, before restarting for another two (2) weeks. Repeat as needed  The most common side effects of topical steroid medications include changes in skin pigment and thinning of the skin. If the steroid is only applied to affected areas of the skin, these effects rarely occur unless the steroid is used for a very long time (years without stopping).   If we prescribed you a strong steroid, please avoid applying to face, groin, or neck, unless we tell you otherwise. We will include more detail in your prescription instructions.    Wound Care Instructions  Cleanse wound gently with soap and water once a day then pat dry with clean gauze. Apply a thin coat of Petrolatum (petroleum jelly, Vaseline) over the wound (unless you have an allergy to this). We recommend that you use a new, sterile tube of Vaseline. Do not pick or remove scabs. Do not remove the yellow or white healing tissue from the base of the wound.  Cover the wound with fresh, clean, nonstick gauze and secure with paper tape. You may use Band-Aids in place of gauze and tape if the wound is small enough, but would recommend trimming much of the tape off as there is often too much. Sometimes Band-Aids can irritate the skin.  You should call the office for your biopsy report after 1 week if you have not already been contacted.  If you experience any problems, such as abnormal amounts of bleeding, swelling, significant bruising, significant pain, or evidence of infection, please call the office immediately.  FOR ADULT SURGERY PATIENTS: If you need  something for pain relief you may take 1 extra strength Tylenol (acetaminophen) AND 2 Ibuprofen (200mg  each) together every 4 hours as needed for pain. (do not take these if you are allergic to them or if you have a reason you should not take them.) Typically, you may only need pain medication for 1 to 3 days.      We recommend an over-the-counter lotion called Amlactin.  This contains a mild acid exfoliant which can help minimize skin cell build-up.      Due to recent changes in healthcare laws, you may see results of your pathology and/or laboratory studies on MyChart before the doctors have had a chance to review them. We understand that in some cases there may be results that are confusing or concerning to you. Please understand that not all results are received at the same time and often the doctors may need to interpret multiple results in order to provide you with the best plan of care or course of treatment. Therefore, we ask that you please give us  2 business days to thoroughly review all your results before contacting the office for clarification. Should we see a critical lab result, you will be contacted sooner.   If You Need Anything After Your Visit  If you have any questions or concerns for your doctor, please call our main line at [phone] and press option 4 to reach your doctor's medical assistant. If no one answers, please leave a voicemail as directed and we will return your call  as soon as possible. Messages left after 4 pm will be answered the following business day.   You may also send us  a message via MyChart. We typically respond to MyChart messages within 1-2 business days.  For prescription refills, please ask your pharmacy to contact our office. Our fax number is [phone].  If you have an urgent issue when the clinic is closed that cannot wait until the next business day, you can page your doctor at the number below.    Please note that while we do our best  to be available for urgent issues outside of office hours, we are not available 24/7.   If you have an urgent issue and are unable to reach us , you may choose to seek medical care at your doctor's office, retail clinic, urgent care center, or emergency room.  If you have a medical emergency, please immediately call 911 or go to the emergency department.  Pager Numbers  - Dr. Hester: [phone]  - Dr. Jackquline: [phone]  - Dr. Claudene: [phone]   - Dr. Raymund: [phone]  In the event of inclement weather, please call our main line at [phone] for an update on the status of any delays or closures.  Dermatology Medication Tips: Please keep the boxes that topical medications come in in order to help keep track of the instructions about where and how to use these. Pharmacies typically print the medication instructions only on the boxes and not directly on the medication tubes.   If your medication is too expensive, please contact our office at [phone] option 4 or send us  a message through MyChart.   We are unable to tell what your co-pay for medications will be in advance as this is different depending on your insurance coverage. However, we may be able to find a substitute medication at lower cost or fill out paperwork to get insurance to cover a needed medication.   If a prior authorization is required to get your medication covered by your insurance company, please allow us  1-2 business days to complete this process.  Drug prices often vary depending on where the prescription is filled and some pharmacies may offer cheaper prices.  The website www.goodrx.com contains coupons for medications through different pharmacies. The prices here do not account for what the cost may be with help from insurance (it may be cheaper with your insurance), but the website can give you the price if you did not use any insurance.  - You can print the associated coupon and take it with  your prescription to the pharmacy.  - You may also stop by our office during regular business hours and pick up a GoodRx coupon card.  - If you need your prescription sent electronically to a different pharmacy, notify our office through [facility] or by phone at [phone] option 4.     Si Usted Necesita Algo Despus de Su Visita  Tambin puede enviarnos un mensaje a travs de Clinical cytogeneticist. Por lo general respondemos a los mensajes de MyChart en el transcurso de 1 a 2 das hbiles.  Para renovar recetas, por favor pida a su farmacia que se ponga en contacto con nuestra oficina. Randi lakes de fax es [place] [phone].  Si tiene un asunto urgente cuando la clnica est cerrada y que no puede esperar hasta el siguiente da hbil, puede llamar/localizar a su doctor(a) al nmero que aparece a continuacin.   Por favor, tenga en cuenta que aunque hacemos todo lo posible para estar  disponibles para asuntos urgentes fuera del horario de [place], no estamos disponibles las 24 horas del da, los 7 [address]  [address] de la [place].   Si tiene un problema urgente y no puede comunicarse con nosotros, puede optar por buscar atencin mdica  en el consultorio de su doctor(a), en una clnica privada, en un centro de atencin urgente o en una sala de emergencias.  Si tiene Engineer, drilling, por favor llame inmediatamente al 911 o vaya a la sala de emergencias.  Nmeros de bper  - Dr. Hester: [phone]  - Dra. Jackquline: [phone]  - Dr. Claudene: [phone]  - Dra. Kitts: [phone]  En caso de inclemencias del [place], por favor llame a nuestra lnea principal al [phone] para una actualizacin sobre el estado de cualquier retraso o cierre.  Consejos para la medicacin en dermatologa: Por favor, guarde las cajas en las que vienen los medicamentos de uso tpico para ayudarle a seguir las instrucciones sobre dnde y cmo usarlos. Las farmacias generalmente imprimen las instrucciones del  medicamento slo en las cajas y no directamente en los tubos del [place].   Si su medicamento es muy caro, por favor, pngase en contacto con landry rieger llamando al [phone] y presione la opcin 4 o envenos un mensaje a travs de Clinical cytogeneticist.   No podemos decirle cul ser su copago por los medicamentos por adelantado ya que esto es diferente dependiendo de la cobertura de su seguro. Sin embargo, es posible que podamos encontrar un medicamento sustituto a Audiological scientist un formulario para que el seguro cubra el medicamento que se considera necesario.   Si se requiere una autorizacin previa para que su compaa de seguros malta su medicamento, por favor permtanos de 1 a 2 das hbiles para completar este proceso.  Los precios de los medicamentos varan con frecuencia dependiendo del Environmental consultant de dnde se surte la receta y alguna farmacias pueden ofrecer precios ms baratos.  El sitio web www.goodrx.com tiene cupones para medicamentos de Health and safety inspector. Los precios aqu no tienen en cuenta lo que podra costar con la ayuda del seguro (puede ser ms barato con su seguro), pero el sitio web puede darle el precio si no utiliz Tourist information centre manager.  - Puede imprimir el cupn correspondiente y llevarlo con su receta a la farmacia.  - Tambin puede pasar por nuestra oficina durante el horario de atencin regular y Education officer, museum una tarjeta de cupones de GoodRx.  - Si necesita que su receta se enve electrnicamente a una farmacia diferente, informe a nuestra oficina a travs de MyChart de [place] o por telfono llamando al [phone] y presione la opcin 4.

## 2024-06-21 DIAGNOSIS — H6063 Unspecified chronic otitis externa, bilateral: Secondary | ICD-10-CM | POA: Diagnosis not present

## 2024-06-23 ENCOUNTER — Ambulatory Visit: Payer: Self-pay

## 2024-06-23 LAB — SURGICAL PATHOLOGY

## 2024-06-23 NOTE — Telephone Encounter (Signed)
Left message on voicemail to return my call.  

## 2024-06-23 NOTE — Telephone Encounter (Signed)
-----   Message from Lauraine JAYSON Kanaris sent at 06/23/2024  3:12 PM EDT -----       1. Skin, right forearm :       DYSPLASTIC JUNCTIONAL LENTIGINOUS NEVUS WITH MODERATE ATYPIA, LIMITED MARGINS       FREE   Please notify patient with below plan: Your biopsy result showed an abnormal mole called a dysplastic nevus.  It is not abnormal enough that we need to do any further treatment, however if the mole were to return (even a little bit) then  we would need to treat it further with another biopsy or surgery.  Please let us  know if you notice this and we will also follow you during your regular skin check visits.  This mole in particular is  not necessarily a precursor to melanoma, but if you have a lot of these abnormal moles it does mean you have a higher risk of melanoma.    Below is some information from the [employer]: ATYPICAL MOLES are unusual-looking benign (noncancerous) moles, also known as dysplastic nevi (the plural of nevus or mole). Atypical moles may resemble melanoma, and people who have them are at  increased risk of developing melanoma in a mole or elsewhere on the body. The higher the number of these moles someone has, the higher the risk. Those who have 10 or more have 12 times the risk of  developing melanoma compared with the general population.   Heredity appears to play a part in the formation of atypical moles. They tend to run in families, especially in Caucasians; about 2 to 8 percent of Caucasians have these moles. Those who have  atypical moles plus a family history of melanoma (two or more close blood relatives with the disease) have a very high risk of developing melanoma. People who have atypical moles, but no family  history of melanoma, are also at higher risk of developing melanoma compared with the general population. So are those with 50 or more normal moles. All of these high-risk individuals should practice  rigorous daily sun protection, perform a monthly  skin self-examination head to toe and seek regular professional skin exams.  ----- Message ----- From: Interface, Lab In [place] Sent: 06/23/2024   2:58 PM EDT To: Lauraine JAYSON Kanaris, MD

## 2024-06-23 NOTE — Telephone Encounter (Signed)
 Discussed biopsy results with patient

## 2024-06-30 NOTE — Progress Notes (Signed)
 Danielle Jackson                                          MRN: [id number]   06/30/2024   The [facility] Quality Team Specialist reviewed this patient medical record for the purposes of chart review for care gap closure. The following were reviewed: chart review for care gap closure-controlling blood pressure.    [facility] Quality Team

## 2024-07-16 ENCOUNTER — Ambulatory Visit
Admission: EM | Admit: 2024-07-16 | Discharge: 2024-07-16 | Disposition: A | Discharge status: Home / Self Care | Attending: Emergency Medicine | Admitting: Emergency Medicine

## 2024-07-16 DIAGNOSIS — R310 Gross hematuria: Secondary | ICD-10-CM | POA: Diagnosis not present

## 2024-07-16 DIAGNOSIS — I10 Essential (primary) hypertension: Secondary | ICD-10-CM | POA: Insufficient documentation

## 2024-07-16 DIAGNOSIS — N390 Urinary tract infection, site not specified: Secondary | ICD-10-CM | POA: Insufficient documentation

## 2024-07-16 DIAGNOSIS — R319 Hematuria, unspecified: Secondary | ICD-10-CM | POA: Insufficient documentation

## 2024-07-16 LAB — POCT URINE DIPSTICK
Glucose, UA: NEGATIVE mg/dL
Nitrite, UA: POSITIVE — AB
POC PROTEIN,UA: >=300 — AB
Spec Grav, UA: 1.015 (ref 1.010–1.025)
Urobilinogen, UA: 2 U/dL — AB
pH, UA: 5.5 (ref 5.0–8.0)

## 2024-07-16 MED ORDER — CIPROFLOXACIN HCL 250 MG PO TABS: 250.0000 mg | ORAL_TABLET | Freq: Two times a day (BID) | ORAL | 0 refills | Status: AC

## 2024-07-16 NOTE — Discharge Instructions (Addendum)
 Follow up with your primary care provider on Monday.  Go to the emergency department if you have worsening symptoms.    Take the Cipro  as directed for your urine infection.    Follow-up with your nephrologist on Monday as well.  Your blood pressure is elevated today at 170/86; repeat 176/99.  Please have this rechecked by your primary care provider on Monday.

## 2024-07-16 NOTE — ED Provider Notes (Signed)
 Danielle Jackson    CSN: [id number] Arrival date & time: 07/16/24  0946      History   Chief Complaint Chief Complaint  Patient presents with   Hematuria   Urinary Frequency    HPI Danielle Jackson is a 83 y.o. female.  Patient presents with 1 week history of dysuria, urinary frequency, bladder pressure.  Patient noticed gross hematuria this morning.  No fever, chills, abdominal pain, flank pain.  Her medical history includes hypertension, diabetes, CKD 3, proteinuria.  Patient is followed by nephrology; last seen on 12/30/2023; next appointment on 08/16/2024.  The history is provided by the patient and medical records.    Past Medical History:  Diagnosis Date   Diabetes mellitus without complication (HCC)    Dysplastic nevus 06/20/2024   R forearm - moderate   Hyperlipidemia    Hypertension     Patient Active Problem List   Diagnosis Date Noted   Asymptomatic microscopic hematuria 05/06/2024   Acute cystitis without hematuria 11/02/2023   Controlled type 2 diabetes mellitus with stage 3 chronic kidney disease, without long-term current use of insulin (HCC) 05/05/2023   Hyperlipidemia associated with type 2 diabetes mellitus (HCC) 11/04/2022   Annual physical exam 05/08/2022   Hypertension associated with diabetes (HCC) 10/29/2021   Impaired vision in both eyes 10/29/2021   S/P arthroscopic surgery of right knee 05/11/2018   Allergic rhinitis 01/27/2015   Osteopenia 01/27/2015   Avitaminosis D 01/27/2015    Past Surgical History:  Procedure Laterality Date   ABDOMINAL HYSTERECTOMY  1975   CATARACT EXTRACTION  03/01/2001   KNEE SURGERY  03/24/2014    OB History   No obstetric history on file.      Home Medications    Prior to Admission medications   Medication Sig Start Date End Date Taking? Authorizing Provider  ciprofloxacin  (CIPRO ) 250 MG tablet Take 1 tablet (250 mg total) by mouth every 12 (twelve) hours. 07/16/24  Yes Corlis Burnard DEL, NP   Azelastine HCl (ASTEPRO) 0.15 % SOLN Place into the nose.    [provider]  CALCIUM  CARBONATE-VIT D-MIN PO Take by mouth 2 (two) times daily.    [provider]  Cholecalciferol (VITAMIN D ) 2000 UNITS tablet Take by mouth daily.    [provider]  clobetasol ointment (TEMOVATE) 0.05 % Apply 1 gram topically to affected area of skin twice daily. Stop once resolved and restart as needed for flares. Avoid use on face, armpits, groin unless otherwise indicated. 06/20/24   Raymund Lauraine BROCKS, MD  glipiZIDE  (GLUCOTROL  XL) 5 MG 24 hr tablet TAKE 1 TABLET BY MOUTH ONCE A DAY 12/31/23   Pardue, Lauraine SAILOR, DO  ibuprofen (ADVIL,MOTRIN) 200 MG tablet Take by mouth as needed.    [provider]  ketoconazole  (NIZORAL ) 2 % cream Apply to affected area of skin twice daily 06/20/24   Raymund Lauraine BROCKS, MD  ketoconazole  (NIZORAL ) 2 % shampoo Apply 1 Application topically 2 (two) times a week. 02/04/24   Donzella Lauraine SAILOR, DO  losartan  (COZAAR ) 50 MG tablet TAKE ONE TABLET (50 MG TOTAL) BY MOUTH TWO (TWO) TIMES DAILY. 06/17/24   Donzella Lauraine SAILOR, DO  nystatin  (MYCOSTATIN /NYSTOP ) powder Apply 1 Application topically 3 (three) times daily. 02/03/24   Donzella Lauraine SAILOR, DO  ONETOUCH ULTRA TEST test strip USE ONE STRIP TO CHECK GLUCOSE ONCE DAILY 09/22/23   Donzella Lauraine SAILOR, DO  triamcinolone  ointment (KENALOG ) 0.5 % Apply 1 Application topically 2 (two) times daily. 02/03/24  Donzella Lauraine SAILOR, DO    Family History Family History  Problem Relation Age of Onset   Diabetes Mother    Heart disease Mother    Hyperlipidemia Mother    Hypertension Mother    Arthritis Mother    Mental illness Mother    Asthma Father    Cancer Father    Hypertension Sister    Asthma Brother    Breast cancer Neg Hx     Social History Social History   Tobacco Use   Smoking status: Never   Smokeless tobacco: Never  Substance Use Topics   Alcohol use: No    Alcohol/week: 0.0 standard drinks of alcohol   Drug  use: No     Allergies   Amoxicillin, Cefprozil, Hydrochlorothiazide, Lovastatin, Medrol  [methylprednisolone], Metformin hcl, Metronidazole , Nitrofurantoin monohyd macro, Pravastatin sodium, Tetracycline, and Clarithromycin   Review of Systems Review of Systems  Constitutional:  Negative for chills and fever.  Gastrointestinal:  Negative for abdominal pain.  Genitourinary:  Positive for dysuria, frequency and hematuria. Negative for flank pain.     Physical Exam Triage Vital Signs ED Triage Vitals  Encounter Vitals Group     BP 07/16/24 1011 (!) 170/86     Girls Systolic BP Percentile --      Girls Diastolic BP Percentile --      Boys Systolic BP Percentile --      Boys Diastolic BP Percentile --      Pulse Rate 07/16/24 1001 85     Resp 07/16/24 1001 18     Temp 07/16/24 1001 98.2 F (36.8 C)     Temp src --      SpO2 07/16/24 1001 97 %     Weight --      Height --      Head Circumference --      Peak Flow --      Pain Score 07/16/24 1008 8     Pain Loc --      Pain Education --      Exclude from Growth Chart --    No data found.  Updated Vital Signs BP (!) 176/99   Pulse 85   Temp 98.2 F (36.8 C)   Resp 18   Wt 192 lb 12.8 oz (87.5 kg)   SpO2 97%   BMI 33.09 kg/m   Visual Acuity Right Eye Distance:   Left Eye Distance:   Bilateral Distance:    Right Eye Near:   Left Eye Near:    Bilateral Near:     Physical Exam Constitutional:      General: She is not in acute distress. HENT:     Mouth/Throat:     Mouth: Mucous membranes are moist.  Cardiovascular:     Rate and Rhythm: Normal rate and regular rhythm.     Heart sounds: Normal heart sounds.  Pulmonary:     Effort: Pulmonary effort is normal. No respiratory distress.     Breath sounds: Normal breath sounds.  Abdominal:     General: Bowel sounds are normal.     Palpations: Abdomen is soft.     Tenderness: There is no abdominal tenderness. There is no right CVA tenderness, left CVA  tenderness, guarding or rebound.  Neurological:     Mental Status: She is alert.      UC Treatments / Results  Labs (all labs ordered are listed, but only abnormal results are displayed) Labs Reviewed  POCT URINE DIPSTICK - Abnormal; Notable for the  following components:      Result Value   Color, UA red (*)    Clarity, UA cloudy (*)    Bilirubin, UA large (*)    Ketones, POC UA small (15) (*)    Blood, UA large (*)    POC PROTEIN,UA >=300 (*)    Urobilinogen, UA 2.0 (*)    Nitrite, UA Positive (*)    Leukocytes, UA Large (3+) (*)    All other components within normal limits  URINE CULTURE    EKG   Radiology No results found.  Procedures Procedures (including critical care time)  Medications Ordered in UC Medications - No data to display  Initial Impression / Assessment and Plan / UC Course  I have reviewed the triage vital signs and the nursing notes.  Pertinent labs & imaging results that were available during my care of the patient were reviewed by me and considered in my medical decision making (see chart for details).   Gross hematuria, UTI with hematuria, elevated blood pressure reading with hypertension.  Patient's medical history includes hypertension, diabetes, CKD, proteinuria.  Patient is followed by [facility]; she has an appointment on 08/16/2024.  Blood pressure today is 170/86 and repeated at 176/99.  Based on patient's weight today and most recent creatinine of 1.27 on 03/11/2024, creatinine clearance is 47.  Patient has allergies to several medications.  Treating today with Cipro .  Urine culture pending.  Instructed patient to follow-up with her PCP on Monday.  ED precautions given.  Instructed her to follow-up with her nephrologist on Monday also.  Also discussed with patient that her blood pressure is elevated today and needs to be rechecked by her PCP on Monday.  Education provided on hematuria, UTI, managing hypertension.  She  agrees to plan of care.    Final Clinical Impressions(s) / UC Diagnoses   Final diagnoses:  Gross hematuria  Urinary tract infection with hematuria, site unspecified  Elevated blood pressure reading in office with diagnosis of hypertension     Discharge Instructions      Follow up with your primary care provider on Monday.  Go to the emergency department if you have worsening symptoms.    Take the Cipro  as directed for your urine infection.    Follow-up with your nephrologist on Monday as well.  Your blood pressure is elevated today at 170/86; repeat 176/99.  Please have this rechecked by your primary care provider on Monday.          ED Prescriptions     Medication Sig Dispense Auth. Provider   ciprofloxacin  (CIPRO ) 250 MG tablet Take 1 tablet (250 mg total) by mouth every 12 (twelve) hours. 10 tablet Corlis Burnard DEL, NP      PDMP not reviewed this encounter.   Corlis Burnard DEL, NP 07/16/24 1039

## 2024-07-16 NOTE — ED Triage Notes (Signed)
 Patient to Urgent Care with complaints of dysuria/ bladder pain/ hematuria/ urinary frequency.  Symptoms x1 week. Blood this morning.

## 2024-07-17 LAB — URINE CULTURE: Culture: <10000 — AB

## 2024-07-18 ENCOUNTER — Ambulatory Visit (HOSPITAL_COMMUNITY): Payer: Self-pay

## 2024-08-03 ENCOUNTER — Ambulatory Visit

## 2024-08-03 DIAGNOSIS — L409 Psoriasis, unspecified: Secondary | ICD-10-CM | POA: Diagnosis not present

## 2024-08-03 DIAGNOSIS — L219 Seborrheic dermatitis, unspecified: Secondary | ICD-10-CM

## 2024-08-03 NOTE — Patient Instructions (Signed)

## 2024-08-03 NOTE — Progress Notes (Signed)
    Subjective   Danielle Jackson is a 83 y.o. female who presents for the following: Follow up of rash. Patient is established patient   Today patient reports: Rash has resolved and she is only using Clobetasol  PRN.   Review of Systems:    No other skin or systemic complaints except as noted in HPI or Assessment and Plan.  The following portions of the chart were reviewed this encounter and updated as appropriate: medications, allergies, medical history  Relevant Medical History:  n/a   Objective  (SKPE) Well appearing patient in no apparent distress; mood and affect are within normal limits. Examination was performed of the: Waist Up Skin Exam: scalp, head, eyes, ears, nose, lips, neck, chest, axillae, upper extremities, abdomen, back, hands, fingers, fingernails   Examination notable for: exam clear  Examination limited by: Clothing and Patient deferred removal       Assessment & Plan  (SKAP)   Psoriasis vs nummular dermatitis back/bottocks Psoriasis/sebopsoriasis ears  Chronic condition with duration or expected duration over one year. Currently well-controlled. - Differential diagnosis, treatment options, prognosis, risk/ benefit, and side effects of treatment were discussed with the patient.  - Continue clobetasol  ointment 0.05% twice daily to affected skin prn for flares Discussed side effect of super potent topical steroids including atrophy, dyspigmentation, striae, telangectasia, folliculitis, loss of skin pigment, hair growth, tachyphylaxis, risk of systemic absorption with missuse. -  Continue ketoconazole  cream 2% apply topically twice daily as needed for rash on ears  - Recommend OTC Amlactin apply topically daily    Chronic and persistent condition with duration or expected duration over one year. Condition is symptomatic and bothersome to patient. Patient is flaring and not currently at treatment goal.   Level of service outlined above   Patient instructions  (SKPI)   Procedures, orders, diagnosis for this visit:    There are no diagnoses linked to this encounter.  Return to clinic: No follow-ups on file.  I, Emerick Ege, CMA am acting as scribe for Lauraine JAYSON Kanaris, MD.   Documentation: I have reviewed the above documentation for accuracy and completeness, and I agree with the above.  Lauraine JAYSON Kanaris, MD

## 2024-08-08 DIAGNOSIS — E113293 Type 2 diabetes mellitus with mild nonproliferative diabetic retinopathy without macular edema, bilateral: Secondary | ICD-10-CM | POA: Diagnosis not present

## 2024-08-08 DIAGNOSIS — Z9842 Cataract extraction status, left eye: Secondary | ICD-10-CM | POA: Diagnosis not present

## 2024-08-08 DIAGNOSIS — H52213 Irregular astigmatism, bilateral: Secondary | ICD-10-CM | POA: Diagnosis not present

## 2024-08-08 DIAGNOSIS — Z9841 Cataract extraction status, right eye: Secondary | ICD-10-CM | POA: Diagnosis not present

## 2024-08-08 LAB — OPHTHALMOLOGY REPORT-SCANNED

## 2024-08-16 ENCOUNTER — Other Ambulatory Visit: Payer: Self-pay | Admitting: Nephrology

## 2024-08-16 DIAGNOSIS — E1122 Type 2 diabetes mellitus with diabetic chronic kidney disease: Secondary | ICD-10-CM

## 2024-08-16 DIAGNOSIS — N1831 Chronic kidney disease, stage 3a: Secondary | ICD-10-CM

## 2024-08-23 ENCOUNTER — Ambulatory Visit
Admission: RE | Admit: 2024-08-23 | Discharge: 2024-08-23 | Disposition: A | Discharge status: Home / Self Care | Source: Ambulatory Visit | Attending: Nephrology | Admitting: Nephrology

## 2024-08-23 DIAGNOSIS — N1831 Chronic kidney disease, stage 3a: Secondary | ICD-10-CM | POA: Insufficient documentation

## 2024-08-23 DIAGNOSIS — E1122 Type 2 diabetes mellitus with diabetic chronic kidney disease: Secondary | ICD-10-CM | POA: Diagnosis present

## 2024-09-08 NOTE — Progress Notes (Signed)
 Danielle Jackson                                          MRN: [id number]   09/08/2024   The [facility] Quality Team Specialist reviewed this patient medical record for the purposes of chart review for care gap closure. The following were reviewed: chart review for care gap closure-controlling blood pressure.    [facility] Quality Team

## 2024-11-03 ENCOUNTER — Ambulatory Visit: Admitting: Family Medicine

## 2025-01-03 ENCOUNTER — Ambulatory Visit
